# Patient Record
Sex: Female | Born: 1970
Health system: Southern US, Community
[De-identification: ages and names within clinical notes are randomized; demographics above are authoritative.]

## PROBLEM LIST (undated history)

## (undated) DIAGNOSIS — J45909 Unspecified asthma, uncomplicated: Secondary | ICD-10-CM

## (undated) DIAGNOSIS — T7840XA Allergy, unspecified, initial encounter: Secondary | ICD-10-CM

## (undated) HISTORY — PX: TUBAL LIGATION: SHX77

## (undated) HISTORY — DX: Allergy, unspecified, initial encounter: T78.40XA

## (undated) HISTORY — PX: THROAT SURGERY: SHX803

---

## 2013-05-22 ENCOUNTER — Other Ambulatory Visit (HOSPITAL_COMMUNITY): Payer: Self-pay | Admitting: Family Medicine

## 2013-05-22 DIAGNOSIS — Z1231 Encounter for screening mammogram for malignant neoplasm of breast: Secondary | ICD-10-CM

## 2013-05-23 ENCOUNTER — Ambulatory Visit (HOSPITAL_COMMUNITY)
Admission: RE | Admit: 2013-05-23 | Discharge: 2013-05-23 | Disposition: A | Discharge status: Home / Self Care | Payer: 59 | Source: Ambulatory Visit | Attending: Family Medicine | Admitting: Family Medicine

## 2013-05-23 DIAGNOSIS — Z1231 Encounter for screening mammogram for malignant neoplasm of breast: Secondary | ICD-10-CM | POA: Insufficient documentation

## 2013-12-24 ENCOUNTER — Emergency Department (HOSPITAL_COMMUNITY): Payer: 59

## 2013-12-24 ENCOUNTER — Emergency Department (HOSPITAL_COMMUNITY)
Admission: EM | Admit: 2013-12-24 | Discharge: 2013-12-24 | Disposition: A | Discharge status: Home / Self Care | Payer: 59 | Attending: Emergency Medicine | Admitting: Emergency Medicine

## 2013-12-24 ENCOUNTER — Encounter (HOSPITAL_COMMUNITY): Payer: Self-pay | Admitting: Emergency Medicine

## 2013-12-24 DIAGNOSIS — R0602 Shortness of breath: Secondary | ICD-10-CM | POA: Diagnosis present

## 2013-12-24 DIAGNOSIS — J45901 Unspecified asthma with (acute) exacerbation: Secondary | ICD-10-CM | POA: Insufficient documentation

## 2013-12-24 DIAGNOSIS — Z79899 Other long term (current) drug therapy: Secondary | ICD-10-CM | POA: Insufficient documentation

## 2013-12-24 HISTORY — DX: Unspecified asthma, uncomplicated: J45.909

## 2013-12-24 MED ORDER — PREDNISONE 20 MG PO TABS
60.0000 mg | ORAL_TABLET | Freq: Once | ORAL | Status: AC
Start: 2013-12-24 — End: 2013-12-24
  Administered 2013-12-24: 60 mg via ORAL
  Filled 2013-12-24: qty 3

## 2013-12-24 MED ORDER — IPRATROPIUM-ALBUTEROL 0.5-2.5 (3) MG/3ML IN SOLN
3.0000 mL | Freq: Once | RESPIRATORY_TRACT | Status: AC
  Administered 2013-12-24: 3 mL via RESPIRATORY_TRACT
  Filled 2013-12-24: qty 3

## 2013-12-24 MED ORDER — PREDNISONE 20 MG PO TABS: 40.0000 mg | ORAL_TABLET | Freq: Every day | ORAL | Status: DC

## 2013-12-24 MED ORDER — ALBUTEROL SULFATE HFA 108 (90 BASE) MCG/ACT IN AERS: 2.0000 | INHALATION_SPRAY | RESPIRATORY_TRACT | Status: AC | PRN

## 2013-12-24 NOTE — ED Notes (Signed)
Respiratory tech, Trey PaulaJeff, notified that pt need peak flow, he acknowledged.

## 2013-12-24 NOTE — ED Provider Notes (Signed)
CSN: [id number]     Arrival date & time 12/24/13  1528 History   First MD Initiated Contact with Patient 12/24/13 1600     Chief Complaint  Patient presents with  . Shortness of Breath     (Consider location/radiation/quality/duration/timing/severity/associated sxs/prior Treatment) HPI Amber Shaffer is a 43 y.o. female with PMH of asthma presenting with worsening shortness of breath since this morning. Patient has taken two breathing treatments. One at 7am and one at 2pm without significant relief. Patient speaking in full sentences but with increased work of breathing. Last needed steroids for asthma exacerbation 6 months ago. Never hospitalized. No fevers, chills. Patient with dry cough. Children with runny nose but no colds. No recent illnesses. No nausea, vomiting, abdominal pain, chest pain or back pain. No history of smoking.    Past Medical History  Diagnosis Date  . Asthma    Past Surgical History  Procedure Laterality Date  . Tubal ligation     History reviewed. No pertinent family history. History  Substance Use Topics  . Smoking status: Never Smoker   . Smokeless tobacco: Not on file  . Alcohol Use: No   OB History   Grav Para Term Preterm Abortions TAB SAB Ect Mult Living                 Review of Systems  Constitutional: Negative for fever and chills.  HENT: Negative for congestion and rhinorrhea.   Eyes: Negative for itching.  Respiratory: Positive for cough and shortness of breath.   Cardiovascular: Negative for chest pain and palpitations.  Gastrointestinal: Negative for nausea, vomiting and diarrhea.  Genitourinary: Negative for dysuria and hematuria.  Musculoskeletal: Negative for back pain and gait problem.  Skin: Negative for rash.  Neurological: Negative for weakness and headaches.      Allergies  Doxycycline; Other; Clindamycin/lincomycin; Penicillins; and Sulfa antibiotics  Home Medications   Prior to Admission medications   Medication Sig  Start Date End Date Taking? Authorizing Provider  albuterol (PROVENTIL HFA;VENTOLIN HFA) 108 (90 BASE) MCG/ACT inhaler Inhale into the lungs every 6 (six) hours as needed for wheezing or shortness of breath.   Yes Historical Provider, MD  albuterol (PROVENTIL) (2.5 MG/3ML) 0.083% nebulizer solution Take 2.5 mg by nebulization every 6 (six) hours as needed for wheezing or shortness of breath.   Yes Historical Provider, MD  budesonide (PULMICORT) 0.25 MG/2ML nebulizer solution Take 0.25 mg by nebulization 2 (two) times daily as needed.   Yes Historical Provider, MD  citalopram (CELEXA) 40 MG tablet Take 40 mg by mouth daily.   Yes Historical Provider, MD  levothyroxine (SYNTHROID, LEVOTHROID) 88 MCG tablet Take 88 mcg by mouth daily before breakfast.   Yes Historical Provider, MD  topiramate (TOPAMAX) 200 MG tablet Take 200 mg by mouth 2 (two) times daily.   Yes Historical Provider, MD  albuterol (PROVENTIL HFA;VENTOLIN HFA) 108 (90 BASE) MCG/ACT inhaler Inhale 2 puffs into the lungs every 4 (four) hours as needed for wheezing or shortness of breath. 12/24/13   Louann SjogrenVictoria L Rick Carruthers, PA-C  predniSONE (DELTASONE) 20 MG tablet Take 2 tablets (40 mg total) by mouth daily. 12/24/13   Benetta SparVictoria L Demarie Hyneman, PA-C   BP 133/75  Pulse 100  Temp(Src) 98 F (36.7 C) (Oral)  Resp 18  SpO2 99%  LMP 12/06/2013 Physical Exam  Nursing note and vitals reviewed. Constitutional: She appears well-developed and well-nourished. No distress.  HENT:  Head: Normocephalic and atraumatic.  Eyes: Conjunctivae and EOM are normal. Right eye  exhibits no discharge. Left eye exhibits no discharge.  Cardiovascular: Regular rhythm and normal heart sounds.   Tachycardic   Pulmonary/Chest:  Good air movement, equal bilaterally. Mild diffuse wheezing. Increased work of breathing with increased RR. No accessory muscle use. Equal chest expansion   Abdominal: Soft. Bowel sounds are normal. She exhibits no distension. There is no  tenderness.  Neurological: She is alert. She exhibits normal muscle tone. Coordination normal.  Skin: Skin is warm and dry. She is not diaphoretic.    ED Course  Procedures (including critical care time) Labs Review Labs Reviewed - No data to display  Imaging Review Dg Chest 2 View  12/24/2013   CLINICAL DATA:  Shortness of breath, personal history of asthma, 2 breathing treatments at home without relief  EXAM: CHEST  2 VIEW  COMPARISON:  09/12/2010  FINDINGS: Normal heart size, mediastinal contours, and pulmonary vascularity.  Lungs clear.  No pleural effusion or pneumothorax.  Bones unremarkable.  IMPRESSION: No acute abnormalities.   Electronically Signed   By: Ulyses SouthwardMark  Boles M.D.   On: 12/24/2013 17:28     EKG Interpretation None     Meds given in ED:  Medications  ipratropium-albuterol (DUONEB) 0.5-2.5 (3) MG/3ML nebulizer solution 3 mL (3 mLs Nebulization Given 12/24/13 1606)  predniSONE (DELTASONE) tablet 60 mg (60 mg Oral Given 12/24/13 1620)    New Prescriptions   ALBUTEROL (PROVENTIL HFA;VENTOLIN HFA) 108 (90 BASE) MCG/ACT INHALER    Inhale 2 puffs into the lungs every 4 (four) hours as needed for wheezing or shortness of breath.   PREDNISONE (DELTASONE) 20 MG TABLET    Take 2 tablets (40 mg total) by mouth daily.      MDM   Final diagnoses:  Asthma exacerbation   Patient with history of asthma presenting with worsening shortness of breath after 2 nebulizer treatments. Patient with increased HR and work of breathing, good air movement with mild wheezing. O2 stat 96%. Patient given oral steroid dose and another breathing treatment with improvement of symptoms. Patient ambulated with O2 stats 97%-100%. No respiratory distress. Patient is afebrile, nontoxic, and in no acute distress. Patient is appropriate for outpatient management and is stable for discharge. Script for prednisone and refill albuterol.   Discussed return precautions with patient. Discussed all results and  patient verbalizes understanding and agrees with plan.  Case has been discussed with Dr. Fonnie JarvisBednar who agrees with the above plan and to discharge.         Louann SjogrenVictoria L Briell Paulette, PA-C 12/24/13 1757

## 2013-12-24 NOTE — ED Notes (Signed)
PT ambulated with pulse oximetry.  Pt denies light headedness or dizziness. Steady gait. Pt states that she felt tired while ambulating. Pulse ox read between 97-100% during ambulation. HR ranged between 110-135bpm during ambulation.

## 2013-12-24 NOTE — ED Notes (Signed)
Presents with with asthma exacerbation and right lower lobe pain-began this AM-taken 2 nebs treatments with no relief. Having difficulty speaking in full sentences and taking in a full breath- right lower lobe diminished-dry cough-breathing treatments not working.

## 2013-12-24 NOTE — Discharge Instructions (Signed)
Return to the emergency room with worsening of symptoms, new symptoms or with symptoms that are concerning, especially fevers, productive cough, difficulty breathing, worsening shortness of breath, chest pain.  Continue to use home inhalers for shortness of breath as needed. Prednisone daily.    Asthma, Acute Bronchospasm Acute bronchospasm caused by asthma is also referred to as an asthma attack. Bronchospasm means your air passages become narrowed. The narrowing is caused by inflammation and tightening of the muscles in the air tubes (bronchi) in your lungs. This can make it hard to breathe or cause you to wheeze and cough. CAUSES Possible triggers are:  Animal dander from the skin, hair, or feathers of animals.  Dust mites contained in house dust.  Cockroaches.  Pollen from trees or grass.  Mold.  Cigarette or tobacco smoke.  Air pollutants such as dust, household cleaners, hair sprays, aerosol sprays, paint fumes, strong chemicals, or strong odors.  Cold air or weather changes. Cold air may trigger inflammation. Winds increase molds and pollens in the air.  Strong emotions such as crying or laughing hard.  Stress.  Certain medicines such as aspirin or beta-blockers.  Sulfites in foods and drinks, such as dried fruits and wine.  Infections or inflammatory conditions, such as a flu, cold, or inflammation of the nasal membranes (rhinitis).  Gastroesophageal reflux disease (GERD). GERD is a condition where stomach acid backs up into your esophagus.  Exercise or strenuous activity. SIGNS AND SYMPTOMS   Wheezing.  Excessive coughing, particularly at night.  Chest tightness.  Shortness of breath. DIAGNOSIS  Your health care provider will ask you about your medical history and perform a physical exam. A chest X-ray or blood testing may be performed to look for other causes of your symptoms or other conditions that may have triggered your asthma attack. TREATMENT    Treatment is aimed at reducing inflammation and opening up the airways in your lungs. Most asthma attacks are treated with inhaled medicines. These include quick relief or rescue medicines (such as bronchodilators) and controller medicines (such as inhaled corticosteroids). These medicines are sometimes given through an inhaler or a nebulizer. Systemic steroid medicine taken by mouth or given through an IV tube also can be used to reduce the inflammation when an attack is moderate or severe. Antibiotic medicines are only used if a bacterial infection is present.  HOME CARE INSTRUCTIONS   Rest.  Drink plenty of liquids. This helps the mucus to remain thin and be easily coughed up. Only use caffeine in moderation and do not use alcohol until you have recovered from your illness.  Do not smoke. Avoid being exposed to secondhand smoke.  You play a critical role in keeping yourself in good health. Avoid exposure to things that cause you to wheeze or to have breathing problems.  Keep your medicines up-to-date and available. Carefully follow your health care provider's treatment plan.  Take your medicine exactly as prescribed.  When pollen or pollution is bad, keep windows closed and use an air conditioner or go to places with air conditioning.  Asthma requires careful medical care. See your health care provider for a follow-up as advised. If you are more than [redacted] weeks pregnant and you were prescribed any new medicines, let your obstetrician know about the visit and how you are doing. Follow up with your health care provider as directed.  After you have recovered from your asthma attack, make an appointment with your outpatient doctor to talk about ways to reduce the likelihood  of future attacks. If you do not have a doctor who manages your asthma, make an appointment with a primary care doctor to discuss your asthma. SEEK IMMEDIATE MEDICAL CARE IF:   You are getting worse.  You have trouble  breathing. If severe, call your local emergency services (911 in the [place]  You develop chest pain or discomfort.  You are vomiting.  You are not able to keep fluids down.  You are coughing up yellow, green, brown, or bloody sputum.  You have a fever and your symptoms suddenly get worse.  You have trouble swallowing. MAKE SURE YOU:   Understand these instructions.  Will watch your condition.  Will get help right away if you are not doing well or get worse. Document Released: 06/09/2006 Document Revised: 02/27/2013 Document Reviewed: 08/30/2012 [facility] Patient Information 2015 [place] [place]. This information is not intended to replace advice given to you by your health care provider. Make sure you discuss any questions you have with your health care provider.

## 2014-01-04 NOTE — ED Provider Notes (Signed)
Medical screening examination/treatment/procedure(s) were performed by non-physician practitioner and as supervising physician I was immediately available for consultation/collaboration.   EKG Interpretation None       Hurman HornJohn M Gabriell Daigneault, MD 01/04/14 [phone]

## 2014-05-22 NOTE — H&P (Signed)
NAMLynett Shaffer:  Shaffer, Amber                 ACCOUNT NO.:  [id number]  MEDICAL RECORD NO.:  [id number]  LOCATION:  E48C                         FACILITY:  [facility]  PHYSICIAN:  Juluis MireJohn S. Judson Tsan, M.D.   DATE OF BIRTH:  02-07-1971  DATE OF ADMISSION:  12/24/2013 DATE OF DISCHARGE:  12/24/2013                             HISTORY & PHYSICAL   HISTORY OF PRESENT ILLNESS:  [facility] [place]  The patient is a 44 year old, gravida 1, para 0, divorce female, presents for D and E for nonviable 1st trimester pregnancy.  The patient was seen in the office for ultrasound.  She had gestational sac consistent with 8 weeks and 6 days.  There was no fetal pole or yolk sac.  This was consistent with an embryonic pregnancy that is nonviable. She now presents for dilatation and evacuation.  ALLERGIES:  She is allergic to latex and penicillin.  MEDICATIONS:  None.1  PAST MEDICAL HISTORY:  Usual childhood diseases.  No significant sequelae.  No past surgical or obstetrical history.  SOCIAL HISTORY:  Reveals no tobacco use.  FAMILY HISTORY:  Noncontributory.  REVIEW OF SYSTEMS:  Noncontributory.  PHYSICAL EXAMINATION:  VITAL SIGNS:  The patient is afebrile.  Stable vital signs. HEENT:  The patient is normocephalic.  Pupils equal, round, and reactive to light and accommodation.  Extraocular movements were intact.  Sclerae and conjunctivae are clear.  Oropharynx clear. NECK:  Without thyromegaly. BREASTS:  Not examined. LUNGS:  Clear. CARDIOVASCULAR:  Regular rhythm and rate without murmurs or gallops. ABDOMEN:  Benign.  No mass, organomegaly, or tenderness. PELVIC:  Normal external genitalia.  Vaginal mucosa is clear.  Cervix unremarkable.  Uterus 8-9 weeks in size.  Adnexa unremarkable. EXTREMITIES:  Trace edema. NEUROLOGIC:  Grossly in normal limits.  IMPRESSION:  Nonviable first trimester pregnancy.  PLAN:  The patient will undergo  dilatation and evacuation.  The risk of procedure have been discussed.  This includes the risk of infection. The risk of hemorrhage that could require transfusion with the risk of AIDS or hepatitis.  Excessive bleeding could require hysterectomy.  Risk of uterine perforation with injury to adjacent organs.  This can require laparoscopy and exploratory surgery for management risk of deep venous thrombosis and pulmonary embolus.  By her report; blood type is O positive.  RhoGAM will not be required.  The patient expressed understanding of indications and risks.     Juluis MireJohn S. Davionne Mastrangelo, M.D.     JSM/MEDQ  D:  05/22/2014  T:  05/22/2014  Job:  [id number]

## 2014-10-03 ENCOUNTER — Other Ambulatory Visit: Payer: Self-pay | Admitting: Gastroenterology

## 2014-10-03 DIAGNOSIS — R1310 Dysphagia, unspecified: Secondary | ICD-10-CM

## 2014-10-10 ENCOUNTER — Inpatient Hospital Stay: Admission: RE | Admit: 2014-10-10 | Payer: 59 | Source: Ambulatory Visit

## 2014-10-14 ENCOUNTER — Ambulatory Visit
Admission: RE | Admit: 2014-10-14 | Discharge: 2014-10-14 | Disposition: A | Discharge status: Home / Self Care | Payer: 59 | Source: Ambulatory Visit | Attending: Gastroenterology | Admitting: Gastroenterology

## 2014-10-14 DIAGNOSIS — R1310 Dysphagia, unspecified: Secondary | ICD-10-CM

## 2014-12-16 ENCOUNTER — Telehealth: Payer: 59 | Admitting: Nurse Practitioner

## 2014-12-16 DIAGNOSIS — J0101 Acute recurrent maxillary sinusitis: Secondary | ICD-10-CM | POA: Diagnosis not present

## 2014-12-16 MED ORDER — AZITHROMYCIN 250 MG PO TABS: ORAL_TABLET | ORAL | Status: DC

## 2014-12-16 NOTE — Progress Notes (Signed)

## 2015-02-08 ENCOUNTER — Ambulatory Visit (INDEPENDENT_AMBULATORY_CARE_PROVIDER_SITE_OTHER): Payer: 59 | Admitting: Family Medicine

## 2015-02-08 ENCOUNTER — Encounter: Payer: Self-pay | Admitting: Family Medicine

## 2015-02-08 VITALS — BP 110/68 | HR 96 | Temp 98.6°F | Resp 18 | Ht 65.0 in | Wt 206.6 lb

## 2015-02-08 DIAGNOSIS — J209 Acute bronchitis, unspecified: Secondary | ICD-10-CM

## 2015-02-08 DIAGNOSIS — R05 Cough: Secondary | ICD-10-CM

## 2015-02-08 DIAGNOSIS — R5381 Other malaise: Secondary | ICD-10-CM | POA: Diagnosis not present

## 2015-02-08 DIAGNOSIS — R059 Cough, unspecified: Secondary | ICD-10-CM

## 2015-02-08 LAB — POCT INFLUENZA A/B
Influenza A, POC: NEGATIVE
Influenza B, POC: NEGATIVE

## 2015-02-08 MED ORDER — AZITHROMYCIN 250 MG PO TABS: ORAL_TABLET | ORAL | Status: DC

## 2015-02-08 NOTE — Progress Notes (Signed)
[facility] [address], [place] [id number] [phone]  Date:  02/08/2015   Name:  Amber Shaffer   DOB:  [birth year]   MRN:  [id number]  PCP:  Karen KaysOBINSON, ROBERT C III, MD    Chief Complaint: Nasal Congestion; Cough; Sore Throat; Chills; and Headache   History of Present Illness:  Amber Shaffer is a 44 y.o. very pleasant female patient who presents with the following:  Here today as a new patient with illness- for the last 2 weeks she has been needing her albuterol more than usual She also notes sinus pain and pressure, and drainage down her throat Her ears are stuffy, ST off an on Cough is not productive She has not noted a fever but has had chills.   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  No Gi symptoms.  2 of her children have had strep.   She does have asthma but when she is not ill she rarely needs her inhaler.   She was not able to use her neb machine as it is broken    She has several drug allergies but reports she is able to use azithromcyin wihtout difficulty- she last took this a couple of months ago without a problem.  Her drug allergy list says "mycins" but we think this refers to clindamycin which is not a macrolide  There are no active problems to display for this patient.   Past Medical History  Diagnosis Date  . Asthma   . Allergy     Past Surgical History  Procedure Laterality Date  . Tubal ligation    . Throat surgery      Social History  Substance Use Topics  . Smoking status: Never Smoker   . Smokeless tobacco: None  . Alcohol Use: No    Family History  Problem Relation Age of Onset  . Hyperlipidemia Mother   . Hypertension Mother   . Cancer Father   . Heart disease Father   . Cancer Maternal Grandmother   . Heart disease Paternal Grandmother     Allergies  Allergen Reactions  . Doxycycline   . Other     Mycins  . Clindamycin/Lincomycin Hives and Rash  .  Penicillins Rash  . Sulfa Antibiotics Rash    Medication list has been reviewed and updated.  Current Outpatient Prescriptions on File Prior to Visit  Medication Sig Dispense Refill  . albuterol (PROVENTIL HFA;VENTOLIN HFA) 108 (90 BASE) MCG/ACT inhaler Inhale into the lungs every 6 (six) hours as needed for wheezing or shortness of breath.    Marland Kitchen. albuterol (PROVENTIL HFA;VENTOLIN HFA) 108 (90 BASE) MCG/ACT inhaler Inhale 2 puffs into the lungs every 4 (four) hours as needed for wheezing or shortness of breath. 1 Inhaler 0  . citalopram (CELEXA) 40 MG tablet Take 40 mg by mouth daily.    Marland Kitchen. levothyroxine (SYNTHROID, LEVOTHROID) 88 MCG tablet Take 88 mcg by mouth daily before breakfast.    . topiramate (TOPAMAX) 200 MG tablet Take 200 mg by mouth 2 (two) times daily.    Marland Kitchen. albuterol (PROVENTIL) (2.5 MG/3ML) 0.083% nebulizer solution Take 2.5 mg by nebulization every 6 (six) hours as needed for wheezing or shortness of breath.    Marland Kitchen. azithromycin (ZITHROMAX Z-PAK) 250 MG tablet As directed (Patient not taking: Reported on 02/08/2015) 1 each 0  . budesonide (PULMICORT) 0.25 MG/2ML nebulizer solution Take 0.25 mg by  nebulization 2 (two) times daily as needed.    . predniSONE (DELTASONE) 20 MG tablet Take 2 tablets (40 mg total) by mouth daily. (Patient not taking: Reported on 02/08/2015) 10 tablet 0   No current facility-administered medications on file prior to visit.    Review of Systems:  As per HPI- otherwise negative.   Physical Examination: Filed Vitals:   02/08/15 1348  BP: 110/68  Pulse: 106  Temp: 98.6 F (37 C)  Resp: 18   Filed Vitals:   02/08/15 1348  Height:  (1.651 m)  Weight: 206 lb 9.6 oz (93.713 kg)   Body mass index is 34.38 kg/(m^2). Ideal Body Weight: Weight in (lb) to have BMI = 25: 149.9  GEN: WDWN, NAD, Non-toxic, A & O x 3, overweight, looks well HEENT: Atraumatic, Normocephalic. Neck supple. No masses, No LAD.  Bilateral TM wnl, oropharynx normal.   PEERL,EOMI.   Ears and Nose: No external deformity. CV: RRR, No M/G/R. No JVD. No thrill. No extra heart sounds. PULM: CTA B, no wheezes, crackles, rhonchi. No retractions. No resp. distress. No accessory muscle use. ABD: S, NT, ND, +BS. No rebound. No HSM. EXTR: No c/c/e NEURO Normal gait.  PSYCH: Normally interactive. Conversant. Not depressed or anxious appearing.  Calm demeanor.   Results for orders placed or performed in visit on 02/08/15  POCT Influenza A/B  Result Value Ref Range   Influenza A, POC Negative Negative   Influenza B, POC Negative Negative    Assessment and Plan: Cough - Plan: POCT Influenza A/B, azithromycin (ZITHROMAX) 250 MG tablet  Malaise - Plan: POCT Influenza A/B, azithromycin (ZITHROMAX) 250 MG tablet  Acute bronchitis, unspecified organism - Plan: azithromycin (ZITHROMAX) 250 MG tablet  Treat with azithromycin- she was given this recently and is not allergic. Suspect that "mycins" in her allergy list refers to clinda only.  Tried to call pt to confirm but could not reach- will send her a mychart.  Follow-up if not better soon- Sooner if worse.     Signed Abbe Amsterdam, MD

## 2015-02-08 NOTE — Patient Instructions (Signed)
We are going to treat you for bronchitis using azithromycin use as directed  let me know if you are not feeling better soon Try to take it easy, rest, drink plenty of fluids

## 2015-03-12 MED FILL — CITALOPRAM HBR 40 MG TABLET: 40 | 30 days supply | Qty: 30 | Fill #6

## 2015-03-12 MED FILL — LEVOTHYROXINE 88 MCG TABLET: 88 | 90 days supply | Qty: 90 | Fill #1

## 2015-03-12 MED FILL — TROKENDI XR 200 MG CAPSULE: 200 | 30 days supply | Qty: 60 | Fill #1

## 2015-04-14 MED FILL — RIZATRIPTAN 10 MG ODT: 10 | 30 days supply | Qty: 9 | Fill #6

## 2015-04-14 MED FILL — TROKENDI XR 200 MG CAPSULE: 200 | 30 days supply | Qty: 60 | Fill #2

## 2015-04-14 MED FILL — CITALOPRAM HBR 40 MG TABLET: 40 | 30 days supply | Qty: 30 | Fill #0

## 2015-05-15 MED FILL — CITALOPRAM HBR 40 MG TABLET: 40 | 90 days supply | Qty: 90 | Fill #0

## 2015-05-20 MED FILL — TROKENDI XR 200 MG CAPSULE: 200 | 30 days supply | Qty: 60 | Fill #3

## 2015-06-05 MED FILL — LEVOTHYROXINE 88 MCG TABLET: 88 | 90 days supply | Qty: 90 | Fill #0

## 2015-06-26 DIAGNOSIS — M9902 Segmental and somatic dysfunction of thoracic region: Secondary | ICD-10-CM | POA: Diagnosis not present

## 2015-06-26 DIAGNOSIS — M9903 Segmental and somatic dysfunction of lumbar region: Secondary | ICD-10-CM | POA: Diagnosis not present

## 2015-06-26 DIAGNOSIS — M62830 Muscle spasm of back: Secondary | ICD-10-CM | POA: Diagnosis not present

## 2015-06-26 DIAGNOSIS — M542 Cervicalgia: Secondary | ICD-10-CM | POA: Diagnosis not present

## 2015-06-26 DIAGNOSIS — M9901 Segmental and somatic dysfunction of cervical region: Secondary | ICD-10-CM | POA: Diagnosis not present

## 2015-06-30 DIAGNOSIS — M542 Cervicalgia: Secondary | ICD-10-CM | POA: Diagnosis not present

## 2015-06-30 DIAGNOSIS — M9903 Segmental and somatic dysfunction of lumbar region: Secondary | ICD-10-CM | POA: Diagnosis not present

## 2015-06-30 DIAGNOSIS — M9901 Segmental and somatic dysfunction of cervical region: Secondary | ICD-10-CM | POA: Diagnosis not present

## 2015-06-30 DIAGNOSIS — M9902 Segmental and somatic dysfunction of thoracic region: Secondary | ICD-10-CM | POA: Diagnosis not present

## 2015-06-30 DIAGNOSIS — M62830 Muscle spasm of back: Secondary | ICD-10-CM | POA: Diagnosis not present

## 2015-07-01 MED FILL — TROKENDI XR 200 MG CAPSULE: 200 | 30 days supply | Qty: 60 | Fill #4

## 2015-07-09 ENCOUNTER — Telehealth: Payer: 59 | Admitting: Family

## 2015-07-09 DIAGNOSIS — R059 Cough, unspecified: Secondary | ICD-10-CM

## 2015-07-09 DIAGNOSIS — R05 Cough: Secondary | ICD-10-CM

## 2015-07-09 DIAGNOSIS — J209 Acute bronchitis, unspecified: Secondary | ICD-10-CM

## 2015-07-09 MED ORDER — AZITHROMYCIN 250 MG PO TABS: ORAL_TABLET | ORAL | Status: DC

## 2015-07-09 MED ORDER — BENZONATATE 100 MG PO CAPS: 100.0000 mg | ORAL_CAPSULE | Freq: Two times a day (BID) | ORAL | Status: DC | PRN

## 2015-07-09 MED FILL — BENZONATATE 100 MG CAPSULE: 100 | 10 days supply | Qty: 20 | Fill #0

## 2015-07-09 MED FILL — AZITHROMYCIN 250 MG TABLET: 250 | 5 days supply | Qty: 6 | Fill #0

## 2015-07-09 NOTE — Progress Notes (Signed)

## 2015-08-21 MED FILL — TROKENDI XR 200 MG CAPSULE: 200 | 30 days supply | Qty: 60 | Fill #5

## 2015-09-15 MED FILL — CITALOPRAM HBR 40 MG TABLET: 40 | 90 days supply | Qty: 90 | Fill #1

## 2015-09-15 MED FILL — RIZATRIPTAN 10 MG ODT: 10 | 30 days supply | Qty: 9 | Fill #0

## 2015-10-23 MED FILL — TROKENDI XR 200 MG CAPSULE: 200 | 30 days supply | Qty: 60 | Fill #6

## 2015-10-24 MED FILL — LEVOTHYROXINE 88 MCG TABLET: 88 | 90 days supply | Qty: 90 | Fill #0

## 2015-11-17 MED FILL — RIZATRIPTAN 10 MG ODT: 10 | 30 days supply | Qty: 9 | Fill #1

## 2015-12-01 MED FILL — TROKENDI XR 200 MG CAPSULE: 200 | 30 days supply | Qty: 60 | Fill #0

## 2015-12-10 MED FILL — AZITHROMYCIN 500 MG TABLET: 500 | 5 days supply | Qty: 5 | Fill #0

## 2015-12-10 MED FILL — predniSONE 10 MG TABS: 10 | 6 days supply | Qty: 21 | Fill #0

## 2015-12-31 ENCOUNTER — Telehealth: Payer: 59 | Admitting: Nurse Practitioner

## 2015-12-31 DIAGNOSIS — R059 Cough, unspecified: Secondary | ICD-10-CM

## 2015-12-31 DIAGNOSIS — J209 Acute bronchitis, unspecified: Secondary | ICD-10-CM

## 2015-12-31 DIAGNOSIS — R05 Cough: Secondary | ICD-10-CM

## 2015-12-31 MED ORDER — ALBUTEROL SULFATE HFA 108 (90 BASE) MCG/ACT IN AERS: 2.0000 | INHALATION_SPRAY | Freq: Four times a day (QID) | RESPIRATORY_TRACT | 1 refills | Status: AC | PRN

## 2015-12-31 MED ORDER — AZITHROMYCIN 250 MG PO TABS: ORAL_TABLET | ORAL | 0 refills | Status: AC

## 2015-12-31 MED ORDER — BENZONATATE 100 MG PO CAPS
100.0000 mg | ORAL_CAPSULE | Freq: Two times a day (BID) | ORAL | 0 refills | Status: AC | PRN
Start: 2015-12-31 — End: ?

## 2015-12-31 MED FILL — VENTOLIN HFA 90 MCG INHALER: 108 (90 BAS | 25 days supply | Qty: 18 | Fill #0

## 2015-12-31 MED FILL — AZITHROMYCIN 250 MG TABLET: 250 | 5 days supply | Qty: 6 | Fill #0

## 2015-12-31 MED FILL — BENZONATATE 100 MG CAPSULE: 100 | 10 days supply | Qty: 20 | Fill #0

## 2015-12-31 NOTE — Progress Notes (Signed)
We are sorry that you are not feeling well.  Here is how we plan to help!  Based on what you have shared with me it looks like you have upper respiratory tract inflammation that has resulted in a significant cough.  Inflammation and infection in the upper respiratory tract is commonly called bronchitis and has four common causes:  Allergies, Viral Infections, Acid Reflux and Bacterial Infections.  Allergies, viruses and acid reflux are treated by controlling symptoms or eliminating the cause. An example might be a cough caused by taking certain blood pressure medications. You stop the cough by changing the medication. Another example might be a cough caused by acid reflux. Controlling the reflux helps control the cough.  Based on your presentation I believe you most likely have A cough due to bacteria.  When patients have a fever and a productive cough with a change in color or increased sputum production, we are concerned about bacterial bronchitis.  If left untreated it can progress to pneumonia.  If your symptoms do not improve with your treatment plan it is important that you contact your provider.   I have prescribed Azithromyin 250 mg: two tables now and then one tablet daily for 4 additonal days   In addition you may use A prescription cough medication called Tessalon Perles 100mg . You may take 1-2 capsules every 8 hours as needed for your cough. I also refilled albuterol inhaler.    HOME CARE . Only take medications as instructed by your medical team. . Complete the entire course of an antibiotic. . Drink plenty of fluids and get plenty of rest. . Avoid close contacts especially the very young and the elderly . Cover your mouth if you cough or cough into your sleeve. . Always remember to wash your hands . A steam or ultrasonic humidifier can help congestion.    GET HELP RIGHT AWAY IF: . You develop worsening fever. . You become short of breath . You cough up blood. . Your symptoms  persist after you have completed your treatment plan MAKE SURE YOU   Understand these instructions.  Will watch your condition.  Will get help right away if you are not doing well or get worse.  Your e-visit answers were reviewed by a board certified advanced clinical practitioner to complete your personal care plan.  Depending on the condition, your plan could have included both over the counter or prescription medications. If there is a problem please reply  once you have received a response from your provider. Your safety is important to us.  If you have drug allergies check your prescription carefully.    You can use MyChart to ask questions about today's visit, request a non-urgent call back, or ask for a work or school excuse for 24 hours related to this e-Visit. If it has been greater than 24 hours you will need to follow up with your provider, or enter a new e-Visit to address those concerns. You will get an e-mail in the next two days asking about your experience.  I hope that your e-visit has been valuable and will speed your recovery. Thank you for using e-visits.

## 2016-01-22 DIAGNOSIS — S161XXA Strain of muscle, fascia and tendon at neck level, initial encounter: Secondary | ICD-10-CM | POA: Diagnosis not present

## 2016-01-22 DIAGNOSIS — E039 Hypothyroidism, unspecified: Secondary | ICD-10-CM | POA: Diagnosis not present

## 2016-01-22 MED FILL — METHOCARBAMOL 500 MG TABLET: 500 | 7 days supply | Qty: 40 | Fill #0

## 2016-01-22 MED FILL — LEVOTHYROXINE 88 MCG TABLET: 88 | 90 days supply | Qty: 90 | Fill #0

## 2016-01-22 MED FILL — MELOXICAM 15 MG TABLET: 15 | 30 days supply | Qty: 30 | Fill #0

## 2016-01-26 DIAGNOSIS — G43109 Migraine with aura, not intractable, without status migrainosus: Secondary | ICD-10-CM | POA: Diagnosis not present

## 2016-01-26 DIAGNOSIS — R42 Dizziness and giddiness: Secondary | ICD-10-CM | POA: Diagnosis not present

## 2016-01-26 MED FILL — TROKENDI XR 200 MG CAPSULE: 200 | 30 days supply | Qty: 60 | Fill #0

## 2016-01-26 MED FILL — RIZATRIPTAN 10 MG ODT: 10 | 30 days supply | Qty: 9 | Fill #0

## 2016-02-09 MED FILL — CAMBIA 50 MG POWDER PACKET: 50 | 9 days supply | Qty: 9 | Fill #0

## 2016-02-10 MED FILL — CITALOPRAM HBR 40 MG TABLET: 40 | 90 days supply | Qty: 90 | Fill #0

## 2016-02-24 MED FILL — CITALOPRAM HBR 40 MG TABLET: 40 | 90 days supply | Qty: 90 | Fill #1

## 2016-03-02 MED FILL — CAMBIA 50 MG POWDER PACKET: 50 | 9 days supply | Qty: 9 | Fill #1

## 2016-03-02 MED FILL — RIZATRIPTAN 10 MG ODT: 10 | 30 days supply | Qty: 9 | Fill #1

## 2016-03-15 DIAGNOSIS — H52223 Regular astigmatism, bilateral: Secondary | ICD-10-CM | POA: Diagnosis not present

## 2016-03-15 DIAGNOSIS — H5213 Myopia, bilateral: Secondary | ICD-10-CM | POA: Diagnosis not present

## 2016-03-15 MED FILL — TROKENDI XR 200 MG CAPSULE: 200 | 30 days supply | Qty: 60 | Fill #1

## 2016-04-27 MED FILL — TROKENDI XR 200 MG CAPSULE: 200 | 30 days supply | Qty: 60 | Fill #2

## 2016-05-04 MED FILL — RIZATRIPTAN 10 MG ODT: 10 | 30 days supply | Qty: 18 | Fill #2

## 2016-05-04 MED FILL — CAMBIA 50 MG POWDER PACKET: 50 | 9 days supply | Qty: 9 | Fill #2

## 2016-06-03 DIAGNOSIS — Z1231 Encounter for screening mammogram for malignant neoplasm of breast: Secondary | ICD-10-CM | POA: Diagnosis not present

## 2016-06-03 DIAGNOSIS — N926 Irregular menstruation, unspecified: Secondary | ICD-10-CM | POA: Diagnosis not present

## 2016-06-03 DIAGNOSIS — N920 Excessive and frequent menstruation with regular cycle: Secondary | ICD-10-CM | POA: Diagnosis not present

## 2016-06-03 DIAGNOSIS — Z124 Encounter for screening for malignant neoplasm of cervix: Secondary | ICD-10-CM | POA: Diagnosis not present

## 2016-06-03 DIAGNOSIS — Z6836 Body mass index (BMI) 36.0-36.9, adult: Secondary | ICD-10-CM | POA: Diagnosis not present

## 2016-06-03 DIAGNOSIS — N83209 Unspecified ovarian cyst, unspecified side: Secondary | ICD-10-CM | POA: Diagnosis not present

## 2016-06-03 DIAGNOSIS — R102 Pelvic and perineal pain: Secondary | ICD-10-CM | POA: Diagnosis not present

## 2016-06-03 DIAGNOSIS — R938 Abnormal findings on diagnostic imaging of other specified body structures: Secondary | ICD-10-CM | POA: Diagnosis not present

## 2016-06-03 DIAGNOSIS — Z01419 Encounter for gynecological examination (general) (routine) without abnormal findings: Secondary | ICD-10-CM | POA: Diagnosis not present

## 2016-06-03 MED FILL — CAMBIA 50 MG POWDER PACKET: 50 | 9 days supply | Qty: 9 | Fill #3 | Status: TO

## 2016-06-08 MED FILL — TROKENDI XR 200 MG CAPSULE: 200 | 30 days supply | Qty: 60 | Fill #3

## 2016-06-17 DIAGNOSIS — N83209 Unspecified ovarian cyst, unspecified side: Secondary | ICD-10-CM | POA: Diagnosis not present

## 2016-06-17 DIAGNOSIS — N920 Excessive and frequent menstruation with regular cycle: Secondary | ICD-10-CM | POA: Diagnosis not present

## 2016-06-20 DIAGNOSIS — Z79899 Other long term (current) drug therapy: Secondary | ICD-10-CM | POA: Diagnosis not present

## 2016-06-20 DIAGNOSIS — Z882 Allergy status to sulfonamides status: Secondary | ICD-10-CM | POA: Diagnosis not present

## 2016-06-20 DIAGNOSIS — M545 Low back pain: Secondary | ICD-10-CM | POA: Diagnosis not present

## 2016-06-20 DIAGNOSIS — Z880 Allergy status to penicillin: Secondary | ICD-10-CM | POA: Diagnosis not present

## 2016-06-20 DIAGNOSIS — J45909 Unspecified asthma, uncomplicated: Secondary | ICD-10-CM | POA: Diagnosis not present

## 2016-06-20 DIAGNOSIS — N200 Calculus of kidney: Secondary | ICD-10-CM | POA: Diagnosis not present

## 2016-06-20 DIAGNOSIS — S32001A Stable burst fracture of unspecified lumbar vertebra, initial encounter for closed fracture: Secondary | ICD-10-CM | POA: Diagnosis not present

## 2016-06-20 DIAGNOSIS — S32011A Stable burst fracture of first lumbar vertebra, initial encounter for closed fracture: Secondary | ICD-10-CM | POA: Diagnosis not present

## 2016-06-20 DIAGNOSIS — E039 Hypothyroidism, unspecified: Secondary | ICD-10-CM | POA: Diagnosis not present

## 2016-06-20 DIAGNOSIS — Z881 Allergy status to other antibiotic agents status: Secondary | ICD-10-CM | POA: Diagnosis not present

## 2016-06-20 DIAGNOSIS — R112 Nausea with vomiting, unspecified: Secondary | ICD-10-CM | POA: Diagnosis not present

## 2016-06-20 DIAGNOSIS — M542 Cervicalgia: Secondary | ICD-10-CM | POA: Diagnosis not present

## 2016-06-20 DIAGNOSIS — Z041 Encounter for examination and observation following transport accident: Secondary | ICD-10-CM | POA: Diagnosis not present

## 2016-06-21 DIAGNOSIS — Z882 Allergy status to sulfonamides status: Secondary | ICD-10-CM | POA: Diagnosis not present

## 2016-06-21 DIAGNOSIS — S32011A Stable burst fracture of first lumbar vertebra, initial encounter for closed fracture: Secondary | ICD-10-CM | POA: Diagnosis not present

## 2016-06-21 DIAGNOSIS — S0990XA Unspecified injury of head, initial encounter: Secondary | ICD-10-CM | POA: Diagnosis not present

## 2016-06-21 DIAGNOSIS — S32001A Stable burst fracture of unspecified lumbar vertebra, initial encounter for closed fracture: Secondary | ICD-10-CM | POA: Diagnosis not present

## 2016-06-21 DIAGNOSIS — Z880 Allergy status to penicillin: Secondary | ICD-10-CM | POA: Diagnosis not present

## 2016-06-21 DIAGNOSIS — S3993XA Unspecified injury of pelvis, initial encounter: Secondary | ICD-10-CM | POA: Diagnosis not present

## 2016-06-21 DIAGNOSIS — Z041 Encounter for examination and observation following transport accident: Secondary | ICD-10-CM | POA: Diagnosis not present

## 2016-06-21 DIAGNOSIS — N200 Calculus of kidney: Secondary | ICD-10-CM | POA: Diagnosis not present

## 2016-06-21 DIAGNOSIS — Z79899 Other long term (current) drug therapy: Secondary | ICD-10-CM | POA: Diagnosis not present

## 2016-06-21 DIAGNOSIS — M9961 Osseous and subluxation stenosis of intervertebral foramina of cervical region: Secondary | ICD-10-CM | POA: Diagnosis not present

## 2016-06-21 DIAGNOSIS — S298XXA Other specified injuries of thorax, initial encounter: Secondary | ICD-10-CM | POA: Diagnosis not present

## 2016-06-21 DIAGNOSIS — S32010A Wedge compression fracture of first lumbar vertebra, initial encounter for closed fracture: Secondary | ICD-10-CM | POA: Diagnosis not present

## 2016-06-21 DIAGNOSIS — M4312 Spondylolisthesis, cervical region: Secondary | ICD-10-CM | POA: Diagnosis not present

## 2016-06-21 DIAGNOSIS — S22011A Stable burst fracture of first thoracic vertebra, initial encounter for closed fracture: Secondary | ICD-10-CM | POA: Diagnosis not present

## 2016-06-21 DIAGNOSIS — R112 Nausea with vomiting, unspecified: Secondary | ICD-10-CM | POA: Diagnosis not present

## 2016-06-21 DIAGNOSIS — E039 Hypothyroidism, unspecified: Secondary | ICD-10-CM | POA: Diagnosis not present

## 2016-06-21 DIAGNOSIS — R918 Other nonspecific abnormal finding of lung field: Secondary | ICD-10-CM | POA: Diagnosis not present

## 2016-06-21 DIAGNOSIS — M545 Low back pain: Secondary | ICD-10-CM | POA: Diagnosis not present

## 2016-06-21 DIAGNOSIS — M47812 Spondylosis without myelopathy or radiculopathy, cervical region: Secondary | ICD-10-CM | POA: Diagnosis not present

## 2016-06-21 DIAGNOSIS — S3991XA Unspecified injury of abdomen, initial encounter: Secondary | ICD-10-CM | POA: Diagnosis not present

## 2016-06-21 DIAGNOSIS — Z881 Allergy status to other antibiotic agents status: Secondary | ICD-10-CM | POA: Diagnosis not present

## 2016-06-21 DIAGNOSIS — J45909 Unspecified asthma, uncomplicated: Secondary | ICD-10-CM | POA: Diagnosis not present

## 2016-06-24 MED FILL — CITALOPRAM HBR 40 MG TABLET: 40 | 90 days supply | Qty: 90 | Fill #0 | Status: TO

## 2016-06-25 DIAGNOSIS — R110 Nausea: Secondary | ICD-10-CM | POA: Diagnosis not present

## 2016-06-25 DIAGNOSIS — S32001D Stable burst fracture of unspecified lumbar vertebra, subsequent encounter for fracture with routine healing: Secondary | ICD-10-CM | POA: Diagnosis not present

## 2016-06-25 DIAGNOSIS — K5900 Constipation, unspecified: Secondary | ICD-10-CM | POA: Diagnosis not present

## 2016-06-25 DIAGNOSIS — M545 Low back pain: Secondary | ICD-10-CM | POA: Diagnosis not present

## 2016-06-25 DIAGNOSIS — K219 Gastro-esophageal reflux disease without esophagitis: Secondary | ICD-10-CM | POA: Diagnosis not present

## 2016-06-28 DIAGNOSIS — Z880 Allergy status to penicillin: Secondary | ICD-10-CM | POA: Diagnosis not present

## 2016-06-28 DIAGNOSIS — Z882 Allergy status to sulfonamides status: Secondary | ICD-10-CM | POA: Diagnosis not present

## 2016-06-28 DIAGNOSIS — Z79899 Other long term (current) drug therapy: Secondary | ICD-10-CM | POA: Diagnosis not present

## 2016-06-28 DIAGNOSIS — S32011D Stable burst fracture of first lumbar vertebra, subsequent encounter for fracture with routine healing: Secondary | ICD-10-CM | POA: Diagnosis not present

## 2016-06-28 DIAGNOSIS — S32010A Wedge compression fracture of first lumbar vertebra, initial encounter for closed fracture: Secondary | ICD-10-CM | POA: Diagnosis not present

## 2016-07-05 MED FILL — LEVOTHYROXINE 88 MCG TABLET: 88 | 90 days supply | Qty: 90 | Fill #1 | Status: TO

## 2016-07-05 MED FILL — TROKENDI XR 200 MG CAPSULE: 200 | 30 days supply | Qty: 60 | Fill #4

## 2016-07-19 DIAGNOSIS — S32010A Wedge compression fracture of first lumbar vertebra, initial encounter for closed fracture: Secondary | ICD-10-CM | POA: Diagnosis not present

## 2016-07-19 DIAGNOSIS — M40205 Unspecified kyphosis, thoracolumbar region: Secondary | ICD-10-CM | POA: Diagnosis not present

## 2016-07-19 DIAGNOSIS — Z880 Allergy status to penicillin: Secondary | ICD-10-CM | POA: Diagnosis not present

## 2016-07-19 DIAGNOSIS — Z882 Allergy status to sulfonamides status: Secondary | ICD-10-CM | POA: Diagnosis not present

## 2016-07-19 DIAGNOSIS — Z881 Allergy status to other antibiotic agents status: Secondary | ICD-10-CM | POA: Diagnosis not present

## 2016-07-19 DIAGNOSIS — S32010D Wedge compression fracture of first lumbar vertebra, subsequent encounter for fracture with routine healing: Secondary | ICD-10-CM | POA: Diagnosis not present

## 2016-07-19 DIAGNOSIS — S32019D Unspecified fracture of first lumbar vertebra, subsequent encounter for fracture with routine healing: Secondary | ICD-10-CM | POA: Diagnosis not present

## 2016-08-09 MED FILL — TROKENDI XR 200 MG CAPSULE: 200 | 30 days supply | Qty: 60 | Fill #5

## 2016-08-11 DIAGNOSIS — S32019D Unspecified fracture of first lumbar vertebra, subsequent encounter for fracture with routine healing: Secondary | ICD-10-CM | POA: Diagnosis not present

## 2016-08-11 DIAGNOSIS — Z79899 Other long term (current) drug therapy: Secondary | ICD-10-CM | POA: Diagnosis not present

## 2016-08-11 MED FILL — oxyCODONE HCL 5 MG TABS: 5 | 30 days supply | Qty: 120 | Fill #0

## 2016-08-11 MED FILL — MELOXICAM 7.5 MG TABLET: 7.5 | 30 days supply | Qty: 30 | Fill #0 | Status: TO

## 2016-08-16 DIAGNOSIS — Z79899 Other long term (current) drug therapy: Secondary | ICD-10-CM | POA: Diagnosis not present

## 2016-08-16 DIAGNOSIS — S32011D Stable burst fracture of first lumbar vertebra, subsequent encounter for fracture with routine healing: Secondary | ICD-10-CM | POA: Diagnosis not present

## 2016-08-16 DIAGNOSIS — S32010D Wedge compression fracture of first lumbar vertebra, subsequent encounter for fracture with routine healing: Secondary | ICD-10-CM | POA: Diagnosis not present

## 2016-08-16 DIAGNOSIS — S32010A Wedge compression fracture of first lumbar vertebra, initial encounter for closed fracture: Secondary | ICD-10-CM | POA: Diagnosis not present

## 2016-08-16 DIAGNOSIS — Z7951 Long term (current) use of inhaled steroids: Secondary | ICD-10-CM | POA: Diagnosis not present

## 2016-08-16 DIAGNOSIS — Z880 Allergy status to penicillin: Secondary | ICD-10-CM | POA: Diagnosis not present

## 2016-08-16 DIAGNOSIS — Z882 Allergy status to sulfonamides status: Secondary | ICD-10-CM | POA: Diagnosis not present

## 2016-08-17 DIAGNOSIS — F4310 Post-traumatic stress disorder, unspecified: Secondary | ICD-10-CM | POA: Diagnosis not present

## 2016-08-17 MED FILL — SERTRALINE HCL 100 MG TAB: 100 | 30 days supply | Qty: 45 | Fill #0 | Status: TO

## 2016-08-17 MED FILL — DOXAZOSIN MESYLATE 4 MG TAB: 4 | 30 days supply | Qty: 30 | Fill #0 | Status: TO

## 2016-09-13 MED FILL — TROKENDI XR 200 MG CAPSULE: 200 | 30 days supply | Qty: 60 | Fill #6

## 2016-09-16 DIAGNOSIS — F4310 Post-traumatic stress disorder, unspecified: Secondary | ICD-10-CM | POA: Diagnosis not present

## 2016-09-22 ENCOUNTER — Ambulatory Visit: Payer: 59 | Attending: Neurosurgery | Admitting: Physical Therapy

## 2016-09-22 DIAGNOSIS — G8929 Other chronic pain: Secondary | ICD-10-CM | POA: Diagnosis not present

## 2016-09-22 DIAGNOSIS — M25652 Stiffness of left hip, not elsewhere classified: Secondary | ICD-10-CM | POA: Diagnosis not present

## 2016-09-22 DIAGNOSIS — M5442 Lumbago with sciatica, left side: Secondary | ICD-10-CM | POA: Insufficient documentation

## 2016-09-22 DIAGNOSIS — M62830 Muscle spasm of back: Secondary | ICD-10-CM | POA: Diagnosis not present

## 2016-09-22 DIAGNOSIS — M6281 Muscle weakness (generalized): Secondary | ICD-10-CM | POA: Diagnosis not present

## 2016-09-23 ENCOUNTER — Encounter: Payer: Self-pay | Admitting: Physical Therapy

## 2016-09-23 ENCOUNTER — Ambulatory Visit: Payer: 59 | Admitting: Physical Therapy

## 2016-09-23 DIAGNOSIS — M25652 Stiffness of left hip, not elsewhere classified: Secondary | ICD-10-CM | POA: Diagnosis not present

## 2016-09-23 DIAGNOSIS — M62830 Muscle spasm of back: Secondary | ICD-10-CM

## 2016-09-23 DIAGNOSIS — M6281 Muscle weakness (generalized): Secondary | ICD-10-CM | POA: Diagnosis not present

## 2016-09-23 DIAGNOSIS — M5442 Lumbago with sciatica, left side: Principal | ICD-10-CM

## 2016-09-23 DIAGNOSIS — G8929 Other chronic pain: Secondary | ICD-10-CM
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
## 2016-09-24 NOTE — Therapy (Signed)
[facility] [address] [place], [place], [address] Phone: [phone]   Fax:  [phone]  Physical Therapy Treatment  Patient Details  Name: Amber Shaffer MRN: [id number] Date of Birth: [birth year] Referring Provider: Dr Odie Sera   Encounter Date: 09/23/2016      PT End of Session - 09/24/16 0855    Visit Number 2   Number of Visits 16   Date for PT Re-Evaluation 11/18/16   PT Start Time 1500   PT Stop Time 1548   PT Time Calculation (min) 48 min   Activity Tolerance Patient tolerated treatment well   Behavior During Therapy [facility] for tasks assessed/performed      Past Medical History:  Diagnosis Date  . Allergy   . Asthma     Past Surgical History:  Procedure Laterality Date  . THROAT SURGERY    . TUBAL LIGATION      There were no vitals filed for this visit.      Subjective Assessment - 09/23/16 1511    Subjective Patient reports she was very sore last night. She had difficulty taking a shower and sleeping last night. Her pain today is ablout a 4/10. Her pain is cerntralizefd in the lower back.    Limitations Walking   How long can you sit comfortably? < 10 minutes before feeling stiff and sore    How long can you stand comfortably? < 10 minutes without pain    How long can you walk comfortably? limited pain with ambualtion    Diagnostic tests Per patients x-rays show her L1 segment is not fully healed.    Patient Stated Goals to have less pain and to return to work    Currently in Pain? Yes   Pain Score 4    Pain Location Back   Pain Orientation Right;Left;Mid   Pain Descriptors / Indicators Aching   Pain Type Chronic pain   Pain Onset More than a month ago   Pain Frequency Constant   Aggravating Factors  any movement, all household tasks    Pain Relieving Factors nothing    Effect of Pain on Daily Activities difficulty perfroming daily tasks.                          [facility]  Adult PT Treatment/Exercise - 09/24/16 0001      Lumbar Exercises: Seated   Other Seated Lumbar Exercises seated lumbar flexion on ball 2x5; side movement forward on ball 2x5 3 second hold; Ball press 2x10;      Lumbar Exercises: Supine   Other Supine Lumbar Exercises low trunk rotiations in decomporession position 2x10      Modalities   Modalities Electrical Stimulation     Electrical Stimulation   Electrical Stimulation Location to mid Lumbo-Thoracic area 2x10   Electrical Stimulation Parameters IFC    Electrical Stimulation Goals Pain     Manual Therapy   Manual therapy comments Gentle soft tissue mobilization to lumbar spine to decrease spasming and inflammation on massage table.                 PT Education - 09/24/16 0855    Education provided Yes   Education Details symptom mangement, HEP, decreasing inflammation    Person(s) Educated Patient   Methods Explanation;Demonstration;Tactile cues;Verbal cues;Handout   Comprehension Verbalized understanding;Returned demonstration;Verbal cues required;Tactile cues required          PT Short Term Goals - 09/24/16  [id number]      PT SHORT TERM GOAL #1   Title Patient will increase gross lumbar movements by 25%    Time 4   Period Weeks   Status On-going     PT SHORT TERM GOAL #2   Title Patient will increase bilateral hip hip strnegth to 4+/5    Time 4   Period Weeks   Status On-going     PT SHORT TERM GOAL #3   Title Patient will be independent with initial HEP    Time 4   Period Weeks   Status On-going     PT SHORT TERM GOAL #4   Title Patient will demsotrate full passive ROM of the left hip    Time 4   Period Weeks   Status On-going           PT Long Term Goals - 09/23/16 1255      PT LONG TERM GOAL #1   Title Patient will bend to pick item off the floor without pain in order to perfrom household tasks    Time 8   Period Weeks   Status New     PT LONG TERM GOAL #2   Title Patient will ambualte  3000' without increased low back pain in order to perfrom work tasks    Time 8   Period Weeks   Status New     PT LONG TERM GOAL #3   Title Patient will stand for 30 minutes without self reprot of pain in order to perfrom daily tasks    Time 8   Period Weeks   Status New               Plan - 09/24/16 [id number]    Clinical Impression Statement Patient tolerated treatment but she was limited by pain. She was able to tolerated soft tissue work using the Estée Laudermassge chair. She has significant spaing in her paraspinals and her quadratus acrossed from L1. She tolerated light core strengthening exercises and ball exercises as well.    History and Personal Factors relevant to plan of care: Patient healthy otherwise.    Clinical Presentation Evolving   Clinical Decision Making Moderate   Rehab Potential Good   Clinical Impairments Affecting Rehab Potential significant pain    PT Frequency 2x / week   PT Duration 8 weeks   PT Treatment/Interventions ADLs/Self Care Home Management;Cryotherapy;Electrical Stimulation;Iontophoresis 4mg /ml Dexamethasone;Gait training;Stair training;Ultrasound;Traction;Therapeutic activities;Therapeutic exercise;Neuromuscular re-education;Patient/family education;Splinting;Taping;Passive range of motion;Manual techniques   PT Next Visit Plan light manual therapy ; continue with e-stim if benificail; continue with ball movements; consider light scpaular retraction   PT Home Exercise Plan ball roll out on the table, lower trunk rotation, decompression poistion    Consulted and Agree with Plan of Care Patient      Patient will benefit from skilled therapeutic intervention in order to improve the following deficits and impairments:  Abnormal gait, Increased muscle spasms, Pain, Impaired flexibility, Decreased mobility, Decreased strength, Impaired sensation  Visit Diagnosis: Chronic left-sided low back pain with left-sided sciatica  Muscle spasm of back  Muscle weakness  (generalized)  Stiffness of left hip, not elsewhere classified     Problem List There are no active problems to display for this patient.   Dessie Comaavid J Kriss Perleberg PT DPT  09/24/2016, 9:05 AM  [facility] [address] [place], [place], [address] Phone: [phone]   Fax:  [phone]  Name: Amber Shaffer MRN: [id number] Date of Birth: 1970/03/22

## 2016-10-01 ENCOUNTER — Encounter: Payer: Self-pay | Admitting: Physical Therapy

## 2016-10-01 ENCOUNTER — Ambulatory Visit: Payer: 59 | Admitting: Physical Therapy

## 2016-10-01 DIAGNOSIS — M62830 Muscle spasm of back: Secondary | ICD-10-CM

## 2016-10-01 DIAGNOSIS — M6281 Muscle weakness (generalized): Secondary | ICD-10-CM | POA: Diagnosis not present

## 2016-10-01 DIAGNOSIS — M5442 Lumbago with sciatica, left side: Secondary | ICD-10-CM | POA: Diagnosis not present

## 2016-10-01 DIAGNOSIS — G8929 Other chronic pain: Secondary | ICD-10-CM | POA: Diagnosis not present

## 2016-10-01 DIAGNOSIS — M25652 Stiffness of left hip, not elsewhere classified: Secondary | ICD-10-CM

## 2016-10-01 NOTE — Patient Instructions (Signed)
   Lye on your back, knees bent Tighten up your abdominals and buttock muscles Flatten your low back on the bed/mat table Hold 5 seconds Repeat 10 times Perform 2 times a day

## 2016-10-01 NOTE — Therapy (Signed)
[facility] [address] [place], [place], [address] Phone: [phone]   Fax:  [phone]  Physical Therapy Treatment  Patient Details  Name: Amber Shaffer MRN: [id number] Date of Birth: Aug 23, 1970 Referring Provider: Dr Cassell SmilesEldad Hermina BartersJoseph Hadar, MD  Encounter Date: 10/01/2016      PT End of Session - 10/01/16 1204    Visit Number 3   Number of Visits 16   Date for PT Re-Evaluation 11/18/16   PT Start Time 1150   PT Stop Time 1230   PT Time Calculation (min) 40 min   Activity Tolerance Patient tolerated treatment well   Behavior During Therapy [facility] for tasks assessed/performed      Past Medical History:  Diagnosis Date  . Allergy   . Asthma     Past Surgical History:  Procedure Laterality Date  . THROAT SURGERY    . TUBAL LIGATION      There were no vitals filed for this visit.      Subjective Assessment - 10/01/16 1157    Subjective Patient arring to therpay today complaining of 4/10 pain. Pt reporting difficluty sleeping last night after having a busy day yesterday. Pt reporting pain in mid to low back and radiates down both sides.    Limitations Walking   How long can you sit comfortably? < 10 minutes before feeling stiff and sore    How long can you stand comfortably? < 10 minutes without pain    How long can you walk comfortably? limited pain with ambualtion    Diagnostic tests Per patients x-rays show her L1 segment is not fully healed.    Patient Stated Goals to have less pain and to return to work    Currently in Pain? Yes   Pain Score 4    Pain Location Back   Pain Orientation Mid;Lower   Pain Descriptors / Indicators Aching   Pain Type Chronic pain   Pain Onset More than a month ago   Pain Frequency Constant   Aggravating Factors  movements, household tasks, pt reporting pain increases with ADL's   Pain Relieving Factors resting sitting or lying down   Effect of Pain on Daily Activities diffculty  performing all ADL's, walking, and household duties            [facility] - 10/01/16 0001      Assessment   Medical Diagnosis L1 burst fracutre 06/20/2016   Referring Provider Dr Cassell SmilesEldad Hermina BartersJoseph Hadar, MD   Onset Date/Surgical Date 06/20/16   Hand Dominance Right   Next MD Visit Begining of August    Prior Therapy None      Precautions   Precautions None   Precaution Comments Fracture well healing per patient but not fully healed yet. Do not use ultrasound      Restrictions   Weight Bearing Restrictions No                     [facility] Adult PT Treatment/Exercise - 10/01/16 0001      Lumbar Exercises: Supine   Bent Knee Raise 10 reps;2 seconds   Bridge 10 reps;5 seconds;Limitations   Bridge Limitations decrease lift off    Other Supine Lumbar Exercises mild trunk rotations holding 10 seconds to each side x 5    Other Supine Lumbar Exercises PPT with verbal cues for abdominal contraction x 10 holding 5 seconds each     Modalities   Modalities Electrical Stimulation  Programme researcher, broadcasting/film/videolectrical Stimulation   Electrical Stimulation Location to mid Lumbo-Thoracic area 2x10   Electrical Stimulation Action IFC   Electrical Stimulation Parameters 80-150 Hz, x 15 minutes, intensity to pt's tolerance   Electrical Stimulation Goals Pain     Manual Therapy   Manual therapy comments Gentle soft tissue mobilization to lumbar spine to decrease spasming and inflammation in prone position                PT Education - 10/01/16 1202    Education provided Yes   Education Details PPT to add to HEP   Person(s) Educated Patient   Methods Explanation;Demonstration;Verbal cues;Tactile cues   Comprehension Verbalized understanding;Returned demonstration;Verbal cues required;Tactile cues required          PT Short Term Goals - 10/01/16 1236      PT SHORT TERM GOAL #1   Title Patient will increase gross lumbar movements by 25%    Status On-going     PT SHORT TERM GOAL  #2   Title Patient will increase bilateral hip hip strnegth to 4+/5    Status On-going     PT SHORT TERM GOAL #3   Title Patient will be independent with initial HEP    Status On-going     PT SHORT TERM GOAL #4   Title Patient will demsotrate full passive ROM of the left hip    Status On-going           PT Long Term Goals - 10/01/16 1207      PT LONG TERM GOAL #1   Title Patient will bend to pick item off the floor without pain in order to perfrom household tasks    Time 8   Period Weeks   Status New     PT LONG TERM GOAL #2   Title Patient will ambualte 3000' without increased low back pain in order to perfrom work tasks    Time 8     PT LONG TERM GOAL #3   Title Patient will stand for 30 minutes without self reprot of pain in order to perfrom daily tasks    Time 8   Period Weeks   Status New               Plan - 10/01/16 1205    Clinical Impression Statement Patient tolerated treatment well, limited by pain with some exercises. Pt tolerated STW and E-stim well and reported less pain at end of session of 2/10. Continue with skilled PT as pt tolerates and progress toward LTG's.    Rehab Potential Good   Clinical Impairments Affecting Rehab Potential significant pain    PT Frequency 2x / week   PT Duration 8 weeks   PT Treatment/Interventions ADLs/Self Care Home Management;Cryotherapy;Electrical Stimulation;Iontophoresis 4mg /ml Dexamethasone;Gait training;Stair training;Ultrasound;Traction;Therapeutic activities;Therapeutic exercise;Neuromuscular re-education;Patient/family education;Splinting;Taping;Passive range of motion;Manual techniques   PT Next Visit Plan light manual therapy ; continue with e-stim if benificail; continue with ball movements; consider light scpaular retraction   PT Home Exercise Plan ball roll out on the table, lower trunk rotation, decompression poistion    Consulted and Agree with Plan of Care Patient      Patient will benefit from  skilled therapeutic intervention in order to improve the following deficits and impairments:  Abnormal gait, Increased muscle spasms, Pain, Impaired flexibility, Decreased mobility, Decreased strength, Impaired sensation  Visit Diagnosis: Chronic left-sided low back pain with left-sided sciatica  Muscle spasm of back  Muscle weakness (generalized)  Stiffness of left hip, not elsewhere  classified     Problem List There are no active problems to display for this patient.   Sharmon Leyden, MPT 10/01/2016, 12:37 PM  [facility] [address] [place], [place], [address] Phone: [phone]   Fax:  [phone]  Name: Amber Shaffer MRN: [id number] Date of Birth: 1970-09-12

## 2016-10-04 ENCOUNTER — Encounter: Payer: 59 | Admitting: Physical Therapy

## 2016-10-08 ENCOUNTER — Ambulatory Visit: Payer: Medicaid Other | Attending: Neurosurgery | Admitting: Physical Therapy

## 2016-10-08 ENCOUNTER — Telehealth: Payer: Self-pay | Admitting: Physical Therapy

## 2016-10-08 DIAGNOSIS — G8929 Other chronic pain: Secondary | ICD-10-CM | POA: Insufficient documentation

## 2016-10-08 DIAGNOSIS — M5442 Lumbago with sciatica, left side: Secondary | ICD-10-CM | POA: Insufficient documentation

## 2016-10-08 DIAGNOSIS — M6281 Muscle weakness (generalized): Secondary | ICD-10-CM | POA: Insufficient documentation

## 2016-10-08 DIAGNOSIS — M62830 Muscle spasm of back: Secondary | ICD-10-CM | POA: Insufficient documentation

## 2016-10-08 DIAGNOSIS — M25652 Stiffness of left hip, not elsewhere classified: Secondary | ICD-10-CM | POA: Insufficient documentation

## 2016-10-08 NOTE — Telephone Encounter (Signed)
Called patient regarding missed appointment. Patient reports she forgot. She reports she will be at her appointment on Monday.

## 2016-10-11 ENCOUNTER — Encounter: Payer: Self-pay | Admitting: Physical Therapy

## 2016-10-11 ENCOUNTER — Ambulatory Visit: Payer: Medicaid Other | Admitting: Physical Therapy

## 2016-10-11 DIAGNOSIS — M6281 Muscle weakness (generalized): Secondary | ICD-10-CM | POA: Diagnosis not present

## 2016-10-11 DIAGNOSIS — M62830 Muscle spasm of back: Secondary | ICD-10-CM | POA: Diagnosis not present

## 2016-10-11 DIAGNOSIS — G8929 Other chronic pain: Secondary | ICD-10-CM | POA: Diagnosis not present

## 2016-10-11 DIAGNOSIS — M25652 Stiffness of left hip, not elsewhere classified: Secondary | ICD-10-CM

## 2016-10-11 DIAGNOSIS — M5442 Lumbago with sciatica, left side: Principal | ICD-10-CM

## 2016-10-12 NOTE — Therapy (Addendum)
[place], [place], [address] Phone: [phone]   Fax:  [phone]  Physical Therapy Treatment/ Discharge   Patient Details  Name: Amber Shaffer MRN: [id number] Date of Birth: Sep 05, 1970 Referring Provider: Dr Sherlene Shams Suzzanne Cloud, MD  Encounter Date: 10/11/2016      PT End of Session - 10/11/16 1354    Visit Number 4   Number of Visits 16   Date for PT Re-Evaluation 11/18/16   PT Start Time 1336   PT Stop Time 1430   PT Time Calculation (min) 54 min   Activity Tolerance Patient tolerated treatment well   Behavior During Therapy [facility] for tasks assessed/performed      Past Medical History:  Diagnosis Date  . Allergy   . Asthma     Past Surgical History:  Procedure Laterality Date  . THROAT SURGERY    . TUBAL LIGATION      There were no vitals filed for this visit.      Subjective Assessment - 10/11/16 1344    Subjective Patient reports her pain today is about a 3/10. She had a few bad days last week. She had to sit for a long period of time and she ended up having more pain.    Limitations Walking   How long can you sit comfortably? < 10 minutes before feeling stiff and sore    How long can you stand comfortably? < 10 minutes without pain    How long can you walk comfortably? limited pain with ambualtion    Diagnostic tests Per patients x-rays show her L1 segment is not fully healed.    Patient Stated Goals to have less pain and to return to work    Currently in Pain? Yes   Pain Score 3    Pain Location Back   Pain Orientation Lower;Mid   Pain Descriptors / Indicators Aching   Pain Type Chronic pain   Pain Onset More than a month ago   Pain Frequency Constant   Aggravating Factors  movements and household tasks   Pain Relieving Factors resting and sititng down    Effect of Pain on Daily Activities difficulty perfroming ADL's                          [facility] Adult PT  Treatment/Exercise - 10/12/16 0001      Lumbar Exercises: Seated   Other Seated Lumbar Exercises seated lumbar flexion on ball 2x10; side movement forward on ball 2x10 3 second hold; Ball press 2x10;      Lumbar Exercises: Supine   Clam Limitations 2x10 with abdominal breathing    Other Supine Lumbar Exercises mild trunk rotations holding 10 seconds to each side x 5    Other Supine Lumbar Exercises PPT with verbal cues for abdominal contraction x 10 holding 5 seconds each     Electrical Stimulation   Electrical Stimulation Location to mid Lumbo-Thoracic area 2x10   Electrical Stimulation Action IFC    Electrical Stimulation Parameters to patient tolerance    Electrical Stimulation Goals Pain     Manual Therapy   Manual therapy comments Gentle soft tissue mobilization to lumbar spine to decrease spasming and inflammation on massage table.                 PT Education - 10/11/16 1352    Education provided Yes   Education Details improtance of core stabilization    Person(s)  Educated Patient   Methods Explanation;Demonstration;Tactile cues;Verbal cues   Comprehension Verbalized understanding;Returned demonstration;Verbal cues required;Tactile cues required          PT Short Term Goals - 10/11/16 1359      PT SHORT TERM GOAL #1   Title Patient will increase gross lumbar movements by 25%    Time 4   Period Weeks   Status On-going     PT SHORT TERM GOAL #2   Title Patient will increase bilateral hip hip strnegth to 4+/5    Time 4   Period Weeks   Status On-going     PT SHORT TERM GOAL #3   Title Patient will be independent with initial HEP    Time 4   Period Weeks   Status On-going     PT SHORT TERM GOAL #4   Title Patient will demsotrate full passive ROM of the left hip    Time 4   Period Weeks   Status On-going           PT Long Term Goals - 10/01/16 1207      PT LONG TERM GOAL #1   Title Patient will bend to pick item off the floor without pain  in order to perfrom household tasks    Time 8   Period Weeks   Status New     PT LONG TERM GOAL #2   Title Patient will ambualte 3000' without increased low back pain in order to perfrom work tasks    Time 8     PT LONG TERM GOAL #3   Title Patient will stand for 30 minutes without self reprot of pain in order to perfrom daily tasks    Time 8   Period Weeks   Status New               Plan - 10/11/16 1355    Clinical Impression Statement Patient was 6 minutes late to her appointment. She tolerated treatment well. Her ability to move has improved. She was stiff after lying prone for about 10 minutes. She was able to perfrom an open book stretch. She continues to have a tight band acrosss L1 but it does not appear to be as large. She would beneift from further skilled therapy.    History and Personal Factors relevant to plan of care: patient healthy otherwise    Clinical Presentation Evolving   Clinical Decision Making Moderate   Rehab Potential Good   Clinical Impairments Affecting Rehab Potential significant pain    PT Frequency 2x / week   PT Duration 8 weeks   PT Treatment/Interventions ADLs/Self Care Home Management;Cryotherapy;Electrical Stimulation;Iontophoresis '4mg'$ /ml Dexamethasone;Gait training;Stair training;Ultrasound;Traction;Therapeutic activities;Therapeutic exercise;Neuromuscular re-education;Patient/family education;Splinting;Taping;Passive range of motion;Manual techniques   PT Next Visit Plan light manual therapy ; continue with e-stim if benificail; continue with ball movements; consider light scpaular retraction   PT Home Exercise Plan ball roll out on the table, lower trunk rotation, decompression poistion    Consulted and Agree with Plan of Care Patient      Patient will benefit from skilled therapeutic intervention in order to improve the following deficits and impairments:  Abnormal gait, Increased muscle spasms, Pain, Impaired flexibility, Decreased  mobility, Decreased strength, Impaired sensation  Visit Diagnosis: Chronic left-sided low back pain with left-sided sciatica  Muscle spasm of back  Muscle weakness (generalized)  Stiffness of left hip, not elsewhere classified    PHYSICAL THERAPY DISCHARGE SUMMARY  Visits from Start of Care: 4  Current functional level  related to goals / functional outcomes: Unknown    Remaining deficits: Unknown    Education / Equipment: Unknown  Plan: Patient agrees to discharge.  Patient goals were not met. Patient is being discharged due to meeting the stated rehab goals.  ?????      Problem List There are no active problems to display for this patient.   Carney Living  PT DPT  10/12/2016, 8:00 AM  [facility] [address] [place], [place], [address] Phone: [phone]   Fax:  [phone]  Name: ROSLIND MICHAUX MRN: [id number] Date of Birth: [birth year]

## 2016-10-13 ENCOUNTER — Ambulatory Visit: Payer: Medicaid Other | Admitting: Physical Therapy

## 2016-10-18 ENCOUNTER — Encounter: Payer: 59 | Admitting: Physical Therapy

## 2016-10-20 ENCOUNTER — Encounter: Payer: 59 | Admitting: Physical Therapy

## 2016-10-25 ENCOUNTER — Encounter: Payer: 59 | Admitting: Physical Therapy

## 2016-10-27 ENCOUNTER — Encounter: Payer: 59 | Admitting: Physical Therapy

## 2016-11-01 ENCOUNTER — Encounter: Payer: 59 | Admitting: Physical Therapy

## 2017-05-18 IMAGING — RF DG ESOPHAGUS
12 of 15 series · 19 of 24 positions shown · non-contrast
Comparison: None.

CLINICAL DATA: Dysphagia, food getting stuck with recent [employer]
maneuver being performed

EXAM:
ESOPHOGRAM/BARIUM SWALLOW
TECHNIQUE: Single contrast examination was performed using  thin barium.
FLUOROSCOPY TIME:  Radiation Exposure Index (as provided by the
fluoroscopic device): 62 Gy per sq cm
If the device does not provide the exposure index:
Fluoroscopy Time:  1 minutes 48 seconds
Number of Acquired Images:

[Series 1: run · 2 of 9 slices shown (1 of 12)]
[im 1/9]
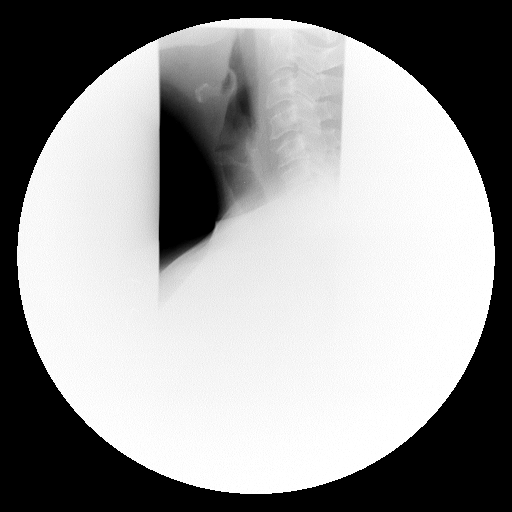
[im 9/9]
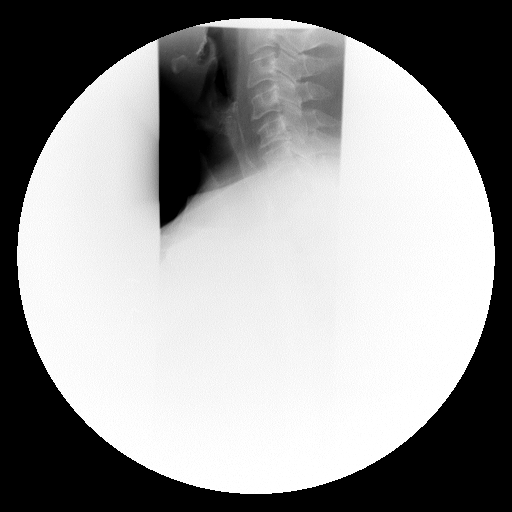

[Series 2: run · 7 of 19 slices shown (2 of 12)]
[im 3/19]
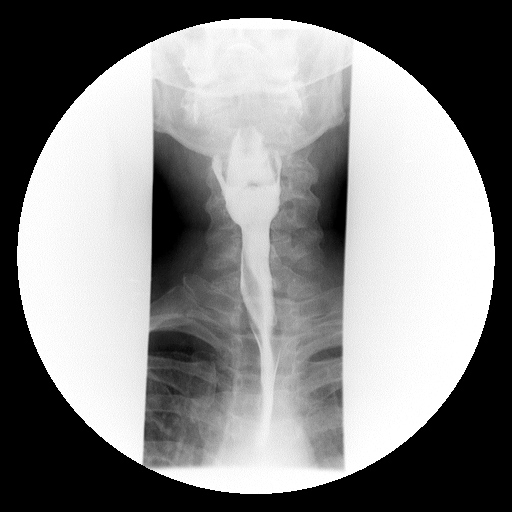
[im 5/19]
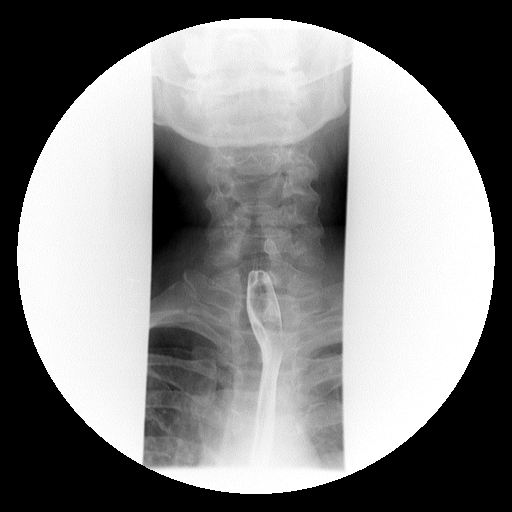
[im 7/19]
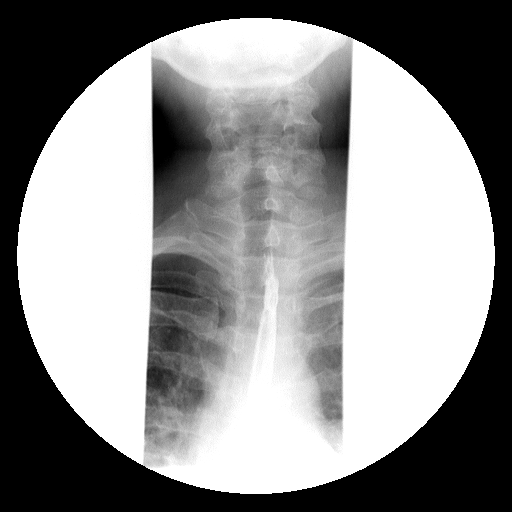
[im 10/19]
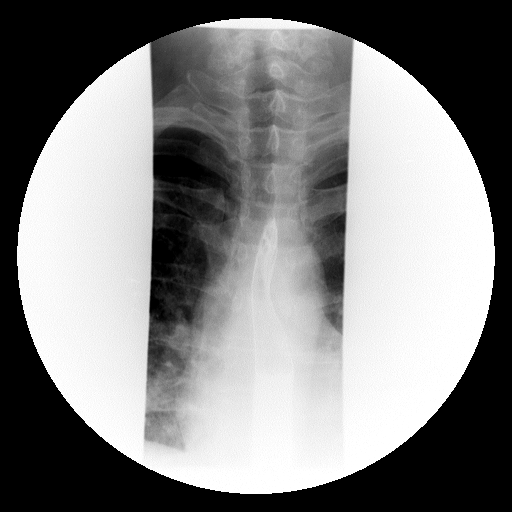
[im 14/19]
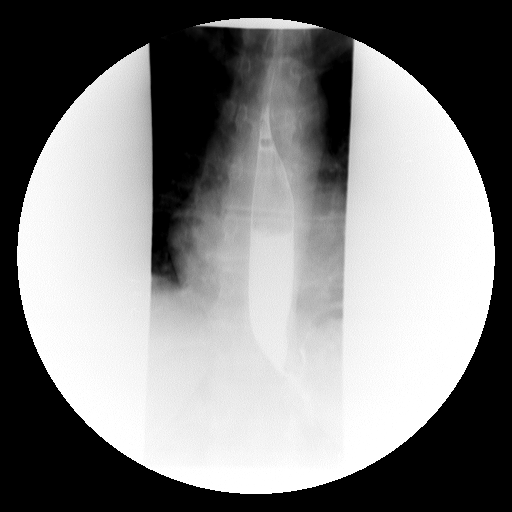
[im 16/19]
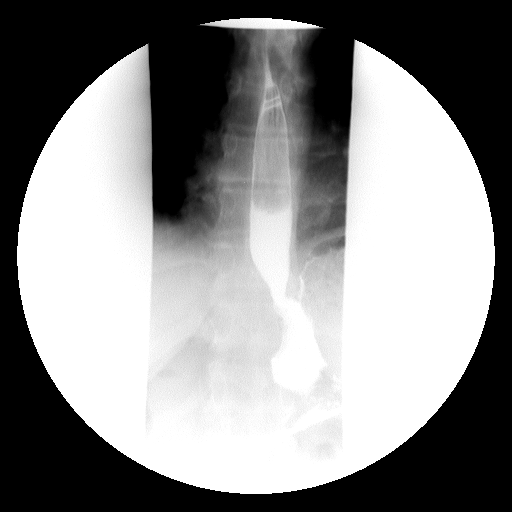
[im 19/19]
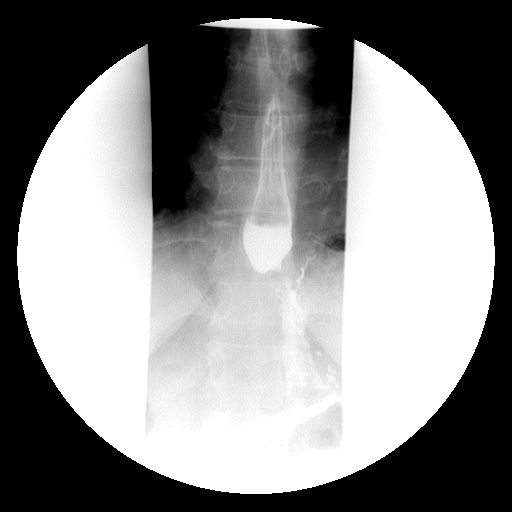

[Series 5: run · 1 of 1 slices shown (3 of 12)]
[im 1/1]
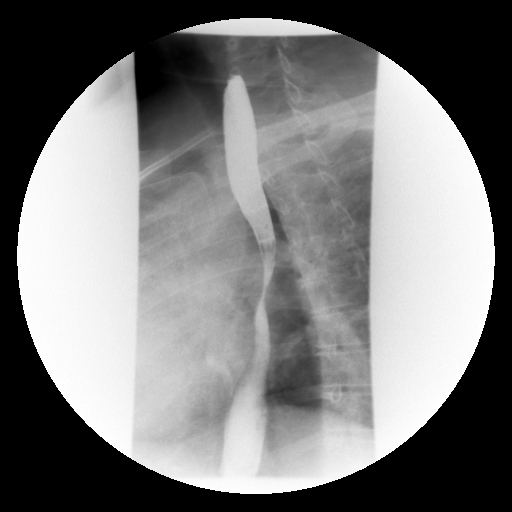

[Series 6: run · 1 of 1 slices shown (4 of 12)]
[im 1/1]
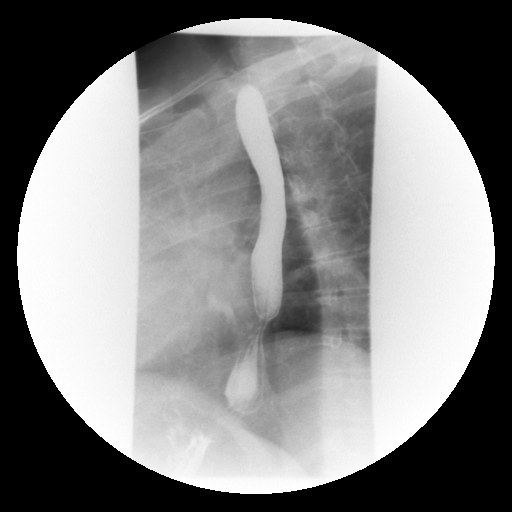

[Series 7: run · 1 of 1 slices shown (5 of 12)]
[im 1/1]
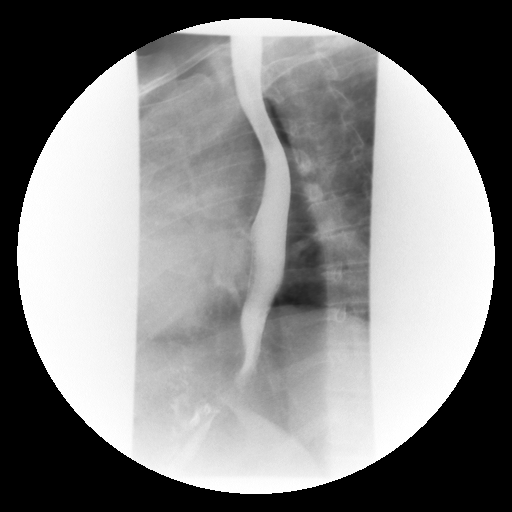

[Series 8: run · 1 of 1 slices shown (6 of 12)]
[im 1/1]
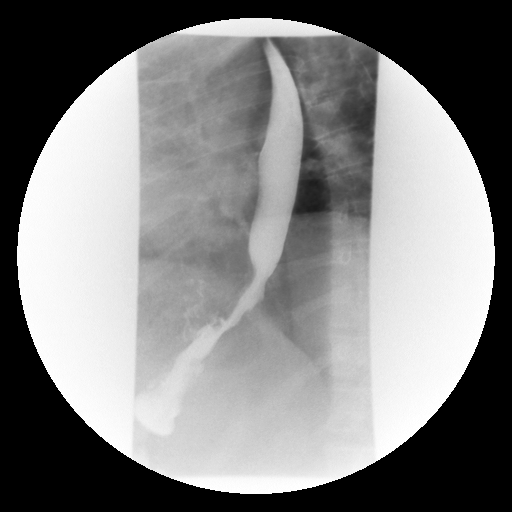

[Series 10: run · 1 of 1 slices shown (7 of 12)]
[im 1/1]
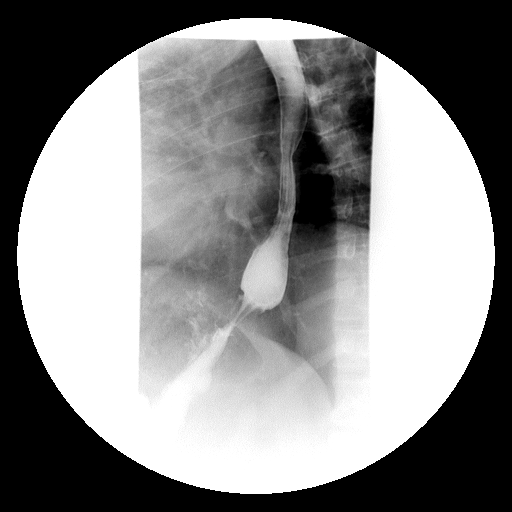

[Series 11: run · 1 of 1 slices shown (8 of 12)]
[im 1/1]
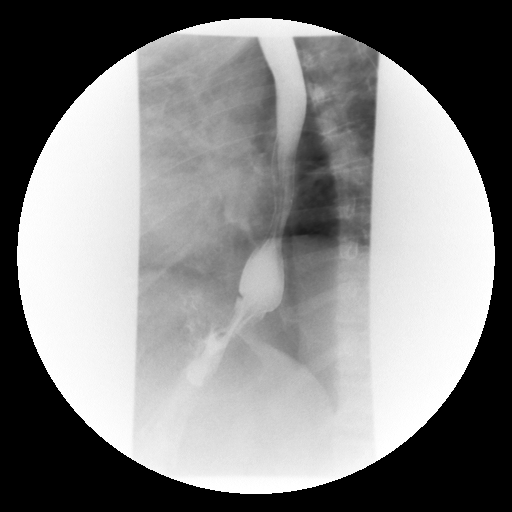

[Series 12: run · 1 of 1 slices shown (9 of 12)]
[im 1/1]
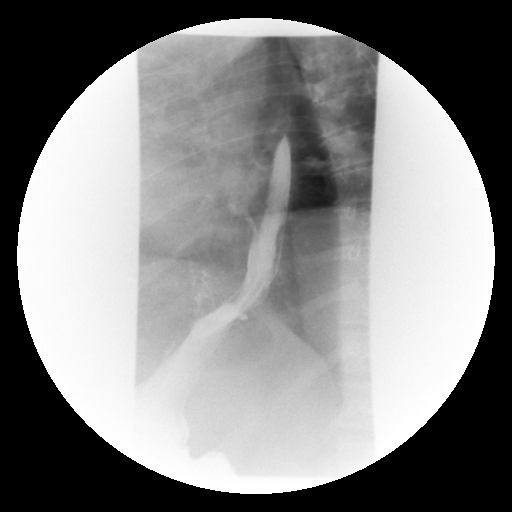

[Series 13: run · 1 of 1 slices shown (10 of 12)]
[im 1/1]
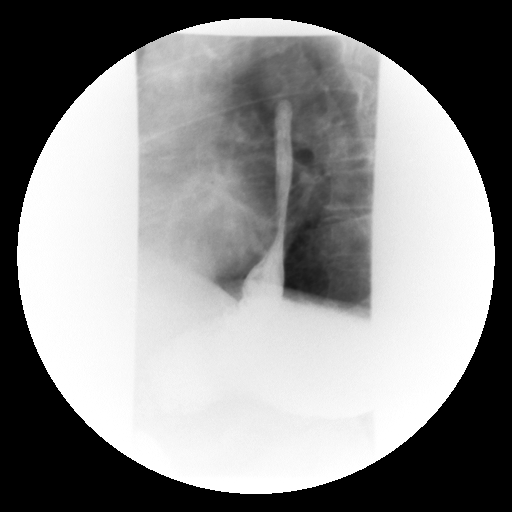

[Series 15: run · 1 of 1 slices shown (11 of 12)]
[im 1/1]
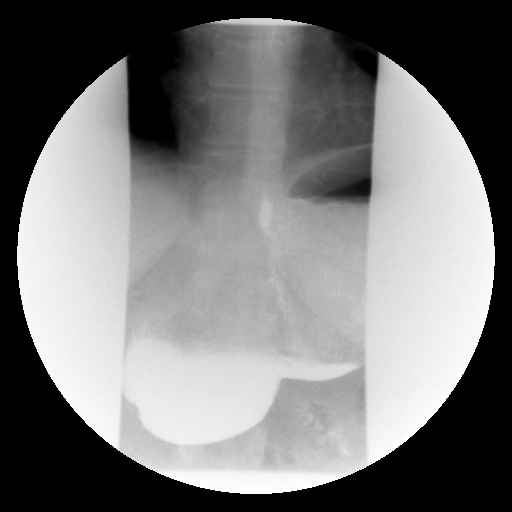

[Series 16: run · 1 of 1 slices shown (12 of 12)]
[im 1/1]
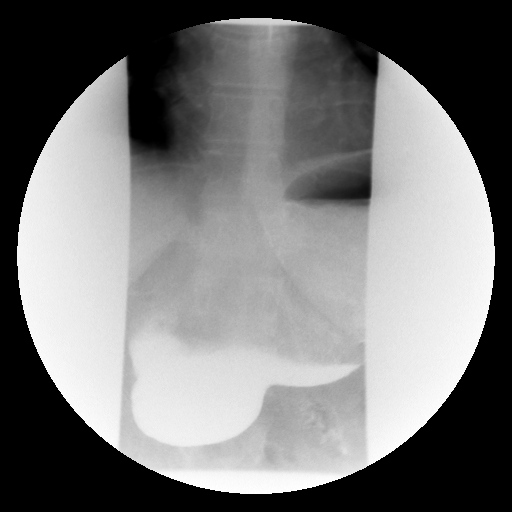

[19 of 24 positions shown; findings below may reference images not displayed]

FINDINGS: A single contrast barium swallow was performed. The study was begun
on the lateral projection to evaluate for aspiration. No aspiration
is seen, and the swallowing mechanism is unremarkable. Esophageal
peristalsis is normal. There is a small hiatal hernia demonstrated.
Moderate gastroesophageal reflux is seen. The barium pill was given
at the end of the study and the pill did lodge just above the hiatal
hernia. After water and barium was given the pill did eventually
pass into the stomach, raising the question of a mild distal
esophageal stricture.
IMPRESSION: 1. Small hiatal hernia with moderate gastroesophageal reflux.
2. Barium pill does delay in passing into the stomach just above the
hiatal hernia suggesting a mild distal esophageal stricture.

## 2018-09-16 DIAGNOSIS — E7800 Pure hypercholesterolemia, unspecified: Secondary | ICD-10-CM | POA: Diagnosis not present

## 2018-09-16 DIAGNOSIS — J309 Allergic rhinitis, unspecified: Secondary | ICD-10-CM

## 2018-09-16 DIAGNOSIS — F4310 Post-traumatic stress disorder, unspecified: Secondary | ICD-10-CM

## 2018-09-16 DIAGNOSIS — J4530 Mild persistent asthma, uncomplicated: Secondary | ICD-10-CM

## 2018-09-16 DIAGNOSIS — R072 Precordial pain: Secondary | ICD-10-CM | POA: Diagnosis not present

## 2018-09-16 DIAGNOSIS — R079 Chest pain, unspecified: Secondary | ICD-10-CM

## 2018-09-16 DIAGNOSIS — E039 Hypothyroidism, unspecified: Secondary | ICD-10-CM

## 2018-09-16 DIAGNOSIS — G43909 Migraine, unspecified, not intractable, without status migrainosus: Secondary | ICD-10-CM

## 2018-09-16 DIAGNOSIS — N390 Urinary tract infection, site not specified: Secondary | ICD-10-CM | POA: Diagnosis not present

## 2018-09-16 DIAGNOSIS — R10811 Right upper quadrant abdominal tenderness: Secondary | ICD-10-CM

## 2018-09-16 DIAGNOSIS — K219 Gastro-esophageal reflux disease without esophagitis: Secondary | ICD-10-CM

## 2018-09-17 DIAGNOSIS — R079 Chest pain, unspecified: Secondary | ICD-10-CM | POA: Diagnosis not present

## 2018-09-17 DIAGNOSIS — E7800 Pure hypercholesterolemia, unspecified: Secondary | ICD-10-CM | POA: Diagnosis not present

## 2018-09-17 DIAGNOSIS — N390 Urinary tract infection, site not specified: Secondary | ICD-10-CM | POA: Diagnosis not present

## 2018-09-17 DIAGNOSIS — R10811 Right upper quadrant abdominal tenderness: Secondary | ICD-10-CM | POA: Diagnosis not present

## 2018-09-18 DIAGNOSIS — R10811 Right upper quadrant abdominal tenderness: Secondary | ICD-10-CM | POA: Diagnosis not present

## 2018-09-18 DIAGNOSIS — R079 Chest pain, unspecified: Secondary | ICD-10-CM

## 2018-09-18 DIAGNOSIS — N390 Urinary tract infection, site not specified: Secondary | ICD-10-CM | POA: Diagnosis not present

## 2018-09-18 DIAGNOSIS — E7800 Pure hypercholesterolemia, unspecified: Secondary | ICD-10-CM | POA: Diagnosis not present

## 2022-06-08 DIAGNOSIS — Z9889 Other specified postprocedural states: Secondary | ICD-10-CM | POA: Diagnosis not present

## 2022-06-08 DIAGNOSIS — R42 Dizziness and giddiness: Secondary | ICD-10-CM | POA: Diagnosis not present

## 2022-06-08 DIAGNOSIS — G43109 Migraine with aura, not intractable, without status migrainosus: Secondary | ICD-10-CM | POA: Diagnosis not present

## 2022-07-27 DIAGNOSIS — M19031 Primary osteoarthritis, right wrist: Secondary | ICD-10-CM | POA: Diagnosis not present

## 2022-09-30 DIAGNOSIS — G43109 Migraine with aura, not intractable, without status migrainosus: Secondary | ICD-10-CM | POA: Diagnosis not present

## 2022-09-30 DIAGNOSIS — R42 Dizziness and giddiness: Secondary | ICD-10-CM | POA: Diagnosis not present

## 2022-09-30 DIAGNOSIS — Z9889 Other specified postprocedural states: Secondary | ICD-10-CM | POA: Diagnosis not present

## 2022-11-23 DIAGNOSIS — Z1231 Encounter for screening mammogram for malignant neoplasm of breast: Secondary | ICD-10-CM | POA: Diagnosis not present

## 2022-11-23 DIAGNOSIS — R222 Localized swelling, mass and lump, trunk: Secondary | ICD-10-CM | POA: Diagnosis not present

## 2022-12-30 DIAGNOSIS — F4321 Adjustment disorder with depressed mood: Secondary | ICD-10-CM | POA: Diagnosis not present

## 2022-12-30 DIAGNOSIS — E039 Hypothyroidism, unspecified: Secondary | ICD-10-CM | POA: Diagnosis not present

## 2023-01-10 DIAGNOSIS — F322 Major depressive disorder, single episode, severe without psychotic features: Secondary | ICD-10-CM | POA: Diagnosis not present

## 2023-01-17 DIAGNOSIS — F322 Major depressive disorder, single episode, severe without psychotic features: Secondary | ICD-10-CM | POA: Diagnosis not present

## 2023-01-27 DIAGNOSIS — Z1211 Encounter for screening for malignant neoplasm of colon: Secondary | ICD-10-CM | POA: Diagnosis not present

## 2023-01-27 DIAGNOSIS — F322 Major depressive disorder, single episode, severe without psychotic features: Secondary | ICD-10-CM | POA: Diagnosis not present

## 2023-02-08 DIAGNOSIS — F322 Major depressive disorder, single episode, severe without psychotic features: Secondary | ICD-10-CM | POA: Diagnosis not present

## 2023-02-21 DIAGNOSIS — Z1211 Encounter for screening for malignant neoplasm of colon: Secondary | ICD-10-CM | POA: Diagnosis not present

## 2023-02-21 DIAGNOSIS — Z79899 Other long term (current) drug therapy: Secondary | ICD-10-CM | POA: Diagnosis not present

## 2023-02-21 DIAGNOSIS — K648 Other hemorrhoids: Secondary | ICD-10-CM | POA: Diagnosis not present

## 2023-02-21 DIAGNOSIS — Z881 Allergy status to other antibiotic agents status: Secondary | ICD-10-CM | POA: Diagnosis not present

## 2023-02-21 DIAGNOSIS — K219 Gastro-esophageal reflux disease without esophagitis: Secondary | ICD-10-CM | POA: Diagnosis not present

## 2023-02-21 DIAGNOSIS — Z880 Allergy status to penicillin: Secondary | ICD-10-CM | POA: Diagnosis not present

## 2023-02-21 DIAGNOSIS — G4733 Obstructive sleep apnea (adult) (pediatric): Secondary | ICD-10-CM | POA: Diagnosis not present

## 2023-02-21 DIAGNOSIS — J45909 Unspecified asthma, uncomplicated: Secondary | ICD-10-CM | POA: Diagnosis not present

## 2023-02-21 DIAGNOSIS — Z882 Allergy status to sulfonamides status: Secondary | ICD-10-CM | POA: Diagnosis not present

## 2023-02-21 DIAGNOSIS — G43909 Migraine, unspecified, not intractable, without status migrainosus: Secondary | ICD-10-CM | POA: Diagnosis not present

## 2023-02-21 DIAGNOSIS — E7800 Pure hypercholesterolemia, unspecified: Secondary | ICD-10-CM | POA: Diagnosis not present

## 2023-02-21 DIAGNOSIS — K644 Residual hemorrhoidal skin tags: Secondary | ICD-10-CM | POA: Diagnosis not present

## 2023-02-21 DIAGNOSIS — F4310 Post-traumatic stress disorder, unspecified: Secondary | ICD-10-CM | POA: Diagnosis not present

## 2023-02-21 DIAGNOSIS — E039 Hypothyroidism, unspecified: Secondary | ICD-10-CM | POA: Diagnosis not present

## 2023-02-23 DIAGNOSIS — F322 Major depressive disorder, single episode, severe without psychotic features: Secondary | ICD-10-CM | POA: Diagnosis not present

## 2023-03-15 DIAGNOSIS — R42 Dizziness and giddiness: Secondary | ICD-10-CM | POA: Diagnosis not present

## 2023-03-22 DIAGNOSIS — F322 Major depressive disorder, single episode, severe without psychotic features: Secondary | ICD-10-CM | POA: Diagnosis not present

## 2023-03-29 DIAGNOSIS — F322 Major depressive disorder, single episode, severe without psychotic features: Secondary | ICD-10-CM | POA: Diagnosis not present
